# Patient Record
Sex: Male | Born: 1970 | Race: Black or African American | Hispanic: No | Marital: Single | Smoking: Former smoker
Health system: Southern US, Community
[De-identification: ages and names within clinical notes are randomized; demographics above are authoritative.]

## PROBLEM LIST (undated history)

## (undated) DIAGNOSIS — I10 Essential (primary) hypertension: Secondary | ICD-10-CM

## (undated) DIAGNOSIS — E785 Hyperlipidemia, unspecified: Secondary | ICD-10-CM

## (undated) DIAGNOSIS — E66811 Obesity, class 1: Secondary | ICD-10-CM

## (undated) DIAGNOSIS — E669 Obesity, unspecified: Secondary | ICD-10-CM

## (undated) HISTORY — DX: Obesity, class 1: E66.811

## (undated) HISTORY — DX: Obesity, unspecified: E66.9

## (undated) HISTORY — DX: Hyperlipidemia, unspecified: E78.5

## (undated) HISTORY — DX: Essential (primary) hypertension: I10

---

## 2003-03-16 ENCOUNTER — Encounter: Admission: RE | Admit: 2003-03-16 | Discharge: 2003-03-16 | Payer: Self-pay | Admitting: Family Medicine

## 2003-03-16 ENCOUNTER — Ambulatory Visit (HOSPITAL_COMMUNITY): Admission: RE | Admit: 2003-03-16 | Discharge: 2003-03-16 | Payer: Self-pay | Admitting: Family Medicine

## 2003-04-05 ENCOUNTER — Ambulatory Visit (HOSPITAL_COMMUNITY): Admission: RE | Admit: 2003-04-05 | Discharge: 2003-04-05 | Payer: Self-pay | Admitting: Sports Medicine

## 2003-05-08 ENCOUNTER — Encounter: Admission: RE | Admit: 2003-05-08 | Discharge: 2003-05-08 | Payer: Self-pay | Admitting: Family Medicine

## 2003-11-14 ENCOUNTER — Encounter: Admission: RE | Admit: 2003-11-14 | Discharge: 2003-11-14 | Payer: Self-pay | Admitting: Family Medicine

## 2004-09-09 ENCOUNTER — Ambulatory Visit: Payer: Self-pay | Admitting: Family Medicine

## 2006-01-26 ENCOUNTER — Ambulatory Visit: Payer: Self-pay | Admitting: Family Medicine

## 2006-11-26 DIAGNOSIS — N529 Male erectile dysfunction, unspecified: Secondary | ICD-10-CM

## 2006-11-26 DIAGNOSIS — I10 Essential (primary) hypertension: Secondary | ICD-10-CM

## 2008-02-22 ENCOUNTER — Encounter (INDEPENDENT_AMBULATORY_CARE_PROVIDER_SITE_OTHER): Payer: Self-pay | Admitting: Family Medicine

## 2008-02-22 ENCOUNTER — Ambulatory Visit: Payer: Self-pay | Admitting: Family Medicine

## 2008-02-22 DIAGNOSIS — E669 Obesity, unspecified: Secondary | ICD-10-CM | POA: Insufficient documentation

## 2008-02-22 LAB — CONVERTED CEMR LAB
ALT: 14 units/L (ref 0–53)
AST: 14 units/L (ref 0–37)
Albumin: 4.6 g/dL (ref 3.5–5.2)
Alkaline Phosphatase: 62 units/L (ref 39–117)
BUN: 16 mg/dL (ref 6–23)
HDL: 48 mg/dL (ref >39)
LDL Cholesterol: 133 mg/dL — ABNORMAL HIGH (ref 0–99)
Potassium: 4.4 meq/L (ref 3.5–5.3)
Sodium: 142 meq/L (ref 135–145)

## 2008-02-28 ENCOUNTER — Encounter (INDEPENDENT_AMBULATORY_CARE_PROVIDER_SITE_OTHER): Payer: Self-pay | Admitting: Family Medicine

## 2008-12-13 ENCOUNTER — Ambulatory Visit: Payer: Self-pay | Admitting: Family Medicine

## 2008-12-13 DIAGNOSIS — R0789 Other chest pain: Secondary | ICD-10-CM

## 2009-01-15 ENCOUNTER — Ambulatory Visit: Payer: Self-pay | Admitting: Family Medicine

## 2009-01-15 ENCOUNTER — Encounter (INDEPENDENT_AMBULATORY_CARE_PROVIDER_SITE_OTHER): Payer: Self-pay | Admitting: Family Medicine

## 2009-01-16 ENCOUNTER — Encounter (INDEPENDENT_AMBULATORY_CARE_PROVIDER_SITE_OTHER): Payer: Self-pay | Admitting: Family Medicine

## 2009-01-16 DIAGNOSIS — R944 Abnormal results of kidney function studies: Secondary | ICD-10-CM | POA: Insufficient documentation

## 2009-01-16 LAB — CONVERTED CEMR LAB
CO2: 24 meq/L (ref 19–32)
Calcium: 10.2 mg/dL (ref 8.4–10.5)
Potassium: 4.5 meq/L (ref 3.5–5.3)
Sodium: 142 meq/L (ref 135–145)

## 2009-05-18 ENCOUNTER — Encounter: Payer: Self-pay | Admitting: *Deleted

## 2009-05-18 ENCOUNTER — Ambulatory Visit: Payer: Self-pay | Admitting: Family Medicine

## 2009-05-18 DIAGNOSIS — J029 Acute pharyngitis, unspecified: Secondary | ICD-10-CM

## 2009-05-18 LAB — CONVERTED CEMR LAB: Rapid Strep: NEGATIVE

## 2009-08-10 ENCOUNTER — Emergency Department (HOSPITAL_COMMUNITY): Admission: EM | Admit: 2009-08-10 | Discharge: 2009-08-10 | Payer: Self-pay | Admitting: Family Medicine

## 2009-08-10 ENCOUNTER — Encounter: Payer: Self-pay | Admitting: Family Medicine

## 2010-01-07 ENCOUNTER — Encounter: Payer: Self-pay | Admitting: Family Medicine

## 2010-10-31 NOTE — Miscellaneous (Signed)
Summary: Lisino-HCTZ refill x 1, needs to be seen  Clinical Lists Changes  Medications: Changed medication from LISINOPRIL-HYDROCHLOROTHIAZIDE 20-25 MG TABS (LISINOPRIL-HYDROCHLOROTHIAZIDE) one tablet daily to LISINOPRIL-HYDROCHLOROTHIAZIDE 20-25 MG TABS (LISINOPRIL-HYDROCHLOROTHIAZIDE) one tablet daily you need to been seen before you next refill can be sent in. - Signed Rx of LISINOPRIL-HYDROCHLOROTHIAZIDE 20-25 MG TABS (LISINOPRIL-HYDROCHLOROTHIAZIDE) one tablet daily you need to been seen before you next refill can be sent in.;  #31 x 0;  Signed;  Entered by: Jamie Brookes MD;  Authorized by: Jamie Brookes MD;  Method used: Electronically to [facility] [phone]*, [address] [place], [place]  [address], Ph: [phone], Fax: [phone]    Prescriptions: LISINOPRIL-HYDROCHLOROTHIAZIDE 20-25 MG TABS (LISINOPRIL-HYDROCHLOROTHIAZIDE) one tablet daily you need to been seen before you next refill can be sent in.  #31 x 0   Entered and Authorized by:   Jamie Brookes MD   Signed by:   Jamie Brookes MD on 01/07/2010   Method used:   Electronically to        [facility] [phone]* (retail)       [address]       [place], [place]  [address]       Ph: [phone]       Fax: [phone]   RxID:   [id number]

## 2017-06-22 ENCOUNTER — Encounter: Payer: Self-pay | Admitting: Podiatry

## 2017-06-22 ENCOUNTER — Ambulatory Visit (INDEPENDENT_AMBULATORY_CARE_PROVIDER_SITE_OTHER): Payer: 59

## 2017-06-22 ENCOUNTER — Other Ambulatory Visit: Payer: Self-pay | Admitting: Podiatry

## 2017-06-22 ENCOUNTER — Ambulatory Visit (INDEPENDENT_AMBULATORY_CARE_PROVIDER_SITE_OTHER): Payer: 59 | Admitting: Podiatry

## 2017-06-22 VITALS — BP 94/57 | HR 72 | Resp 16

## 2017-06-22 DIAGNOSIS — M79671 Pain in right foot: Secondary | ICD-10-CM

## 2017-06-22 DIAGNOSIS — M722 Plantar fascial fibromatosis: Secondary | ICD-10-CM

## 2017-06-22 MED ORDER — MELOXICAM 15 MG PO TABS: 15.0000 mg | ORAL_TABLET | Freq: Every day | ORAL | 3 refills | Status: DC

## 2017-06-22 MED ORDER — METHYLPREDNISOLONE 4 MG PO TBPK: ORAL_TABLET | ORAL | 0 refills | Status: DC

## 2017-06-22 NOTE — Patient Instructions (Signed)

## 2017-06-22 NOTE — Progress Notes (Signed)
  Subjective:  Patient ID: Nicholas Madden, male    DOB: Feb 13, 1971,  MRN: [id number] HPI Chief Complaint  Patient presents with  . Foot Pain    Plantar heel right - aching x months, AM pain, when walking feel like walking on side of foot, no treatment    46 y.o. male presents with the above complaint.   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 Denies any trauma to the heel but states that first thing in the morning and after sitting for a while is exquisitely painful.  No past medical history on file. No past surgical history on file.  Current Outpatient Prescriptions:  .  Cholecalciferol (VITAMIN D PO), Take by mouth., Disp: , Rfl:  .  lisinopril-hydrochlorothiazide (PRINZIDE,ZESTORETIC) 20-25 MG per tablet, Take 1 tablet by mouth daily. You need to be seen before you next refill can be sent in. , Disp: , Rfl:   No Known Allergies Review of Systems  All other systems reviewed and are negative.  Objective:   Vitals:   06/22/17 1033  BP: (!) 94/57  Pulse: 72  Resp: 16   General AA&O x3. Patient is alert and oriented.  Vascular Dorsalis pedis and posterior tibial pulses 2/4 bilat. Brisk capillary refill to all digits. Pedal hair present.  Neurologic Epicritic sensation grossly intact.  Dermatologic No open lesions. Interspaces clear of maceration. Nails well groomed and normal in appearance.  Orthopedic: MMT 5/5 in dorsiflexion, plantarflexion, inversion, and eversion. Normal joint ROM without pain or crepitus.Pain on palpation medial calcaneal tubercle of the right heel.    Radiographs:  Radiographs demonstrate soft tissue increase in density at the plantar fascia insertion site right.  Assessment & Plan:   Assessment: Plantar fasciitis right.  Plan: Discussed etiology pathology conservative versus surgical  therapies. Injected the right heel today with Kenalog and local anesthetic started him on a Medrol Dosepak to be followed by meloxicam. Dispensed a fracture brace and night splint. Discussed appropriate shoe gear stretching exercises and ice therapy. He was provided with a instructions in stretching exercises prior to leaving the office.     Max T. [place], [place]

## 2017-07-20 ENCOUNTER — Encounter: Payer: Self-pay | Admitting: Podiatry

## 2017-07-20 ENCOUNTER — Ambulatory Visit (INDEPENDENT_AMBULATORY_CARE_PROVIDER_SITE_OTHER): Payer: 59 | Admitting: Podiatry

## 2017-07-20 ENCOUNTER — Ambulatory Visit: Payer: 59 | Admitting: Podiatry

## 2017-07-20 DIAGNOSIS — M722 Plantar fascial fibromatosis: Secondary | ICD-10-CM | POA: Diagnosis not present

## 2017-07-21 NOTE — Progress Notes (Signed)
He presents today for follow-up of his plantar fasciitis. He states that he is 100% improved.  Objective: Pulses are palpable. He has no pain on palpation medial calcaneal tubercle of the right heel.  Assessment: Plantar fasciitis resolved 100%.  Plan: I highly recommended that he continue all conservative therapies including his plantar fascial brace his night splint and anti-inflammatories. I also recommended that he continue his appropriate shoe gear. I suggested he continue all this 1 more month follow-up with me with any recurrence.

## 2017-08-24 ENCOUNTER — Encounter (INDEPENDENT_AMBULATORY_CARE_PROVIDER_SITE_OTHER): Payer: 59 | Admitting: Podiatry

## 2017-08-24 NOTE — Progress Notes (Signed)
This encounter was created in error - please disregard.

## 2017-10-26 ENCOUNTER — Ambulatory Visit: Payer: 59 | Admitting: Podiatry

## 2017-10-27 ENCOUNTER — Other Ambulatory Visit: Payer: Self-pay

## 2017-10-27 MED ORDER — MELOXICAM 15 MG PO TABS: 15.0000 mg | ORAL_TABLET | Freq: Every day | ORAL | 3 refills | Status: DC

## 2017-10-27 NOTE — Telephone Encounter (Signed)
Pharmacy refill request for Meloxicam.  Per Dr. Al CorpusHyatt, ok to refill    Script sent to pharmacy

## 2018-04-06 ENCOUNTER — Other Ambulatory Visit
Admission: RE | Admit: 2018-04-06 | Discharge: 2018-04-06 | Disposition: A | Discharge status: Home / Self Care | Payer: 59 | Source: Ambulatory Visit | Attending: Internal Medicine | Admitting: Internal Medicine

## 2018-04-06 DIAGNOSIS — I10 Essential (primary) hypertension: Secondary | ICD-10-CM | POA: Diagnosis not present

## 2018-04-06 LAB — LIPID PANEL
Cholesterol: 206 mg/dL — ABNORMAL HIGH (ref 0–200)
HDL: 46 mg/dL (ref >40)
LDL Cholesterol: 145 mg/dL — ABNORMAL HIGH (ref 0–99)
Total CHOL/HDL Ratio: 4.5 RATIO
Triglycerides: 76 mg/dL (ref <150)
VLDL: 15 mg/dL (ref 0–40)

## 2018-04-06 LAB — BASIC METABOLIC PANEL
Anion gap: 5 (ref 5–15)
BUN: 18 mg/dL (ref 6–20)
CO2: 29 mmol/L (ref 22–32)
Calcium: 9.5 mg/dL (ref 8.9–10.3)
Chloride: 108 mmol/L (ref 98–111)
Creatinine, Ser: 1.31 mg/dL — ABNORMAL HIGH (ref 0.61–1.24)
GFR calc Af Amer: >60 mL/min (ref >60)
GFR calc non Af Amer: >60 mL/min (ref >60)
Glucose, Bld: 100 mg/dL — ABNORMAL HIGH (ref 70–99)
Potassium: 4.2 mmol/L (ref 3.5–5.1)
Sodium: 142 mmol/L (ref 135–145)

## 2018-04-06 LAB — TSH: TSH: 1.255 u[IU]/mL (ref 0.350–4.500)

## 2018-04-06 LAB — CBC
HCT: 41.5 % (ref 40.0–52.0)
Hemoglobin: 13.7 g/dL (ref 13.0–18.0)
MCH: 29.7 pg (ref 26.0–34.0)
MCHC: 33.1 g/dL (ref 32.0–36.0)
MCV: 89.7 fL (ref 80.0–100.0)
Platelets: 230 10*3/uL (ref 150–440)
RBC: 4.62 MIL/uL (ref 4.40–5.90)
RDW: 12.3 % (ref 11.5–14.5)
WBC: 4.4 10*3/uL (ref 3.8–10.6)

## 2018-04-07 LAB — VITAMIN D 25 HYDROXY (VIT D DEFICIENCY, FRACTURES): Vit D, 25-Hydroxy: 73.2 ng/mL (ref 30.0–100.0)

## 2018-04-09 LAB — HEMOGLOBIN A1C
Hgb A1c MFr Bld: 4.8 % (ref 4.8–5.6)
Mean Plasma Glucose: 91.06 mg/dL

## 2019-06-02 ENCOUNTER — Emergency Department
Admission: EM | Admit: 2019-06-02 | Discharge: 2019-06-02 | Disposition: A | Discharge status: Home / Self Care | Payer: BC Managed Care – PPO | Attending: Emergency Medicine | Admitting: Emergency Medicine

## 2019-06-02 ENCOUNTER — Other Ambulatory Visit: Payer: Self-pay

## 2019-06-02 ENCOUNTER — Encounter: Payer: Self-pay | Admitting: Emergency Medicine

## 2019-06-02 ENCOUNTER — Emergency Department: Payer: BC Managed Care – PPO

## 2019-06-02 DIAGNOSIS — Y999 Unspecified external cause status: Secondary | ICD-10-CM | POA: Diagnosis not present

## 2019-06-02 DIAGNOSIS — Y92410 Unspecified street and highway as the place of occurrence of the external cause: Secondary | ICD-10-CM | POA: Insufficient documentation

## 2019-06-02 DIAGNOSIS — M542 Cervicalgia: Secondary | ICD-10-CM | POA: Insufficient documentation

## 2019-06-02 DIAGNOSIS — M549 Dorsalgia, unspecified: Secondary | ICD-10-CM | POA: Diagnosis not present

## 2019-06-02 DIAGNOSIS — M25552 Pain in left hip: Secondary | ICD-10-CM | POA: Insufficient documentation

## 2019-06-02 DIAGNOSIS — Y93I9 Activity, other involving external motion: Secondary | ICD-10-CM | POA: Diagnosis not present

## 2019-06-02 DIAGNOSIS — M25512 Pain in left shoulder: Secondary | ICD-10-CM | POA: Diagnosis not present

## 2019-06-02 DIAGNOSIS — M5489 Other dorsalgia: Secondary | ICD-10-CM | POA: Diagnosis not present

## 2019-06-02 MED ORDER — IBUPROFEN 600 MG PO TABS: 600.0000 mg | ORAL_TABLET | Freq: Four times a day (QID) | ORAL | 0 refills | Status: DC | PRN

## 2019-06-02 MED ORDER — CYCLOBENZAPRINE HCL 5 MG PO TABS: ORAL_TABLET | ORAL | 0 refills | Status: DC

## 2019-06-02 MED ORDER — CYCLOBENZAPRINE HCL 10 MG PO TABS
5.0000 mg | ORAL_TABLET | Freq: Once | ORAL | Status: AC
  Administered 2019-06-02: 5 mg via ORAL
  Filled 2019-06-02: qty 1

## 2019-06-02 MED ORDER — IBUPROFEN 600 MG PO TABS
600.0000 mg | ORAL_TABLET | Freq: Once | ORAL | Status: AC
  Administered 2019-06-02: 600 mg via ORAL
  Filled 2019-06-02: qty 1
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
## 2019-06-02 NOTE — Discharge Instructions (Addendum)
Your x-rays do not show any broken bones.  Please place ice on areas that are sore tonight.  You can take ibuprofen for pain and inflammation and Flexeril to help relax her muscles.  You will likely be more sore tomorrow.
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
## 2019-07-07 ENCOUNTER — Other Ambulatory Visit: Payer: Self-pay

## 2019-07-11 ENCOUNTER — Ambulatory Visit (INDEPENDENT_AMBULATORY_CARE_PROVIDER_SITE_OTHER): Payer: BC Managed Care – PPO | Admitting: Family Medicine

## 2019-07-11 ENCOUNTER — Other Ambulatory Visit: Payer: Self-pay

## 2019-07-11 ENCOUNTER — Encounter: Payer: Self-pay | Admitting: Family Medicine

## 2019-07-11 VITALS — BP 112/78 | HR 81 | Temp 96.6°F | Resp 16 | Ht 67.0 in | Wt 207.2 lb

## 2019-07-11 DIAGNOSIS — J302 Other seasonal allergic rhinitis: Secondary | ICD-10-CM

## 2019-07-11 DIAGNOSIS — I10 Essential (primary) hypertension: Secondary | ICD-10-CM

## 2019-07-11 DIAGNOSIS — H938X3 Other specified disorders of ear, bilateral: Secondary | ICD-10-CM

## 2019-07-11 LAB — CBC WITH DIFFERENTIAL/PLATELET
Basophils Absolute: 0 10*3/uL (ref 0.0–0.1)
Basophils Relative: 0.4 % (ref 0.0–3.0)
Eosinophils Absolute: 0.1 10*3/uL (ref 0.0–0.7)
Eosinophils Relative: 2.8 % (ref 0.0–5.0)
HCT: 39 % (ref 39.0–52.0)
Hemoglobin: 12.8 g/dL — ABNORMAL LOW (ref 13.0–17.0)
Lymphocytes Relative: 44.4 % (ref 12.0–46.0)
Lymphs Abs: 1.9 10*3/uL (ref 0.7–4.0)
MCHC: 32.9 g/dL (ref 30.0–36.0)
MCV: 90.3 fl (ref 78.0–100.0)
Monocytes Absolute: 0.4 10*3/uL (ref 0.1–1.0)
Monocytes Relative: 10.4 % (ref 3.0–12.0)
Neutro Abs: 1.8 10*3/uL (ref 1.4–7.7)
Neutrophils Relative %: 42 % — ABNORMAL LOW (ref 43.0–77.0)
Platelets: 244 10*3/uL (ref 150.0–400.0)
RBC: 4.32 Mil/uL (ref 4.22–5.81)
RDW: 12 % (ref 11.5–15.5)
WBC: 4.3 10*3/uL (ref 4.0–10.5)

## 2019-07-11 LAB — COMPREHENSIVE METABOLIC PANEL
ALT: 19 U/L (ref 0–53)
AST: 15 U/L (ref 0–37)
Albumin: 4.4 g/dL (ref 3.5–5.2)
Alkaline Phosphatase: 57 U/L (ref 39–117)
BUN: 11 mg/dL (ref 6–23)
CO2: 28 mEq/L (ref 19–32)
Calcium: 9.8 mg/dL (ref 8.4–10.5)
Chloride: 104 mEq/L (ref 96–112)
Creatinine, Ser: 1.27 mg/dL (ref 0.40–1.50)
GFR: 73.8 mL/min (ref >60.00)
Glucose, Bld: 119 mg/dL — ABNORMAL HIGH (ref 70–99)
Potassium: 4 mEq/L (ref 3.5–5.1)
Sodium: 140 mEq/L (ref 135–145)
Total Bilirubin: 0.9 mg/dL (ref 0.2–1.2)
Total Protein: 6.9 g/dL (ref 6.0–8.3)

## 2019-07-11 LAB — LIPID PANEL
Cholesterol: 187 mg/dL (ref 0–200)
HDL: 43 mg/dL (ref >39.00)
LDL Cholesterol: 130 mg/dL — ABNORMAL HIGH (ref 0–99)
NonHDL: 144.41
Total CHOL/HDL Ratio: 4
Triglycerides: 72 mg/dL (ref 0.0–149.0)
VLDL: 14.4 mg/dL (ref 0.0–40.0)

## 2019-07-11 LAB — HEMOGLOBIN A1C: Hgb A1c MFr Bld: 5.1 % (ref 4.6–6.5)

## 2019-07-11 LAB — TSH: TSH: 1.41 u[IU]/mL (ref 0.35–4.50)

## 2019-07-11 NOTE — Progress Notes (Signed)
Subjective:    Patient ID: Nicholas Madden, male    DOB: March 05, 1971, 48 y.o.   MRN: [id number]  HPI   Patient presents to clinic to establish PCP.  Only takes medication for blood pressure.  Former smoker, quit 15 years ago.    Past medical, social, surgical and family history reviewed and updated accordingly in chart.  Only complaint today is ear fullness, left ear more so.  Does not currently take any sort of allergy medicine.  Denies fever.  Denies severe pain in sinuses.  Denies hearing loss.  Denies thick nasal congestion.  States he has taken medicine for this before, but cannot recall the name.   Patient Active Problem List   Diagnosis Date Noted  . ACUTE PHARYNGITIS 05/18/2009  . NONSPECIFIC ABNORM RESULTS KIDNEY FUNCTION STUDY 01/16/2009  . CHEST PAIN, ATYPICAL 12/13/2008  . OBESITY, UNSPECIFIED 02/22/2008  . Essential hypertension 11/26/2006  . 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 Smoking status: Former Research scientist (life sciences)  . Smokeless tobacco: Never Used  . Tobacco comment: Over 55yr ago  Substance Use Topics  . Alcohol use: No   History reviewed. No pertinent surgical history.  History reviewed. No pertinent family history.   Review of Systems  Constitutional: Negative for chills, fatigue and fever.  HENT: Negative for congestion, sinus pain and sore throat. +left ear pain Eyes: Negative.   Respiratory: Negative for cough, shortness of breath and wheezing.   Cardiovascular: Negative for chest pain, palpitations and leg swelling.  Gastrointestinal: Negative for abdominal pain, diarrhea, nausea and vomiting.  Genitourinary: Negative for dysuria, frequency and urgency.  Musculoskeletal: Negative for arthralgias and myalgias.  Skin: Negative for color change, pallor and rash.  Neurological: Negative for syncope, light-headedness and headaches.  Psychiatric/Behavioral: The patient is not nervous/anxious.       Objective:   Physical Exam  Vitals signs and nursing note reviewed.  Constitutional:      General: He is not in acute distress.    Appearance: He is not toxic-appearing.  HENT:     Right Ear: Ear canal and external ear normal.     Left Ear: Ear canal and external ear normal.     Ears:     Comments: Mild fullness bilat TMs, appears consistent with some fluid buildup, most likely from allergies Eyes:     General: No scleral icterus.    Extraocular Movements: Extraocular movements intact.     Conjunctiva/sclera: Conjunctivae normal.     Pupils: Pupils are equal, round, and reactive to light.  Neck:     Musculoskeletal: Normal range of motion. No neck rigidity.     Vascular: No carotid bruit.  Cardiovascular:     Rate and Rhythm: Normal rate and regular rhythm.     Heart sounds: Normal heart sounds.  Pulmonary:     Effort: Pulmonary effort is normal. No respiratory distress.     Breath sounds: Normal breath sounds.  Neurological:     General: No focal deficit present.     Mental Status: He is alert and oriented to person, place, and time.  Psychiatric:        Mood and Affect: Mood normal.        Behavior: Behavior normal.    Today's Vitals   07/11/19 0821  BP: 112/78  Pulse: 81  Resp: 16  Temp: (!) 96.6 F (35.9 C)  TempSrc: Temporal  SpO2: 98%  Weight: 207 lb 3.2 oz (94 kg)  Height: 5'  7" (1.702 m)   Body mass index is 32.45 kg/m.    Assessment & Plan:    1. Essential hypertension  He will continue lisinopril hydrochlorothiazide as this was we will get blood work in clinic today for electrolytes and screening for thyroid, cholesterol & diabetes.  - CBC w/Diff - Comp Met (CMET) - TSH - Lipid panel - HgB A1c  2. Ear fullness, bilateral / Seasonal allergies  Mild fullness in bilateral tympanic membranes consistent with irritation from seasonal allergies and/or eustachian tube dysfunction.  Advised patient to begin taking oral antihistamine once daily Claritin, Zyrtec or Allegra and also  use Flonase nasal spray daily to help get things open and draining.  Antibiotic, reassured patient that he does not have ear infection and I do not feel antibiotics needed at this time.     Had flu vaccine at work   We will have patient follow-up in 6 months for radiographic recheck on chronic conditions and whenever needed.  Patient aware he can return to clinic anytime questions or concerns.  If lab work reveals a need for patient to return to clinic sooner, we will let him know.

## 2019-11-07 ENCOUNTER — Other Ambulatory Visit: Payer: Self-pay | Admitting: Otolaryngology

## 2019-11-07 DIAGNOSIS — IMO0001 Reserved for inherently not codable concepts without codable children: Secondary | ICD-10-CM

## 2019-11-07 DIAGNOSIS — H9042 Sensorineural hearing loss, unilateral, left ear, with unrestricted hearing on the contralateral side: Secondary | ICD-10-CM

## 2019-11-07 DIAGNOSIS — H90A22 Sensorineural hearing loss, unilateral, left ear, with restricted hearing on the contralateral side: Secondary | ICD-10-CM | POA: Diagnosis not present

## 2019-11-07 DIAGNOSIS — H93292 Other abnormal auditory perceptions, left ear: Secondary | ICD-10-CM | POA: Diagnosis not present

## 2019-11-18 ENCOUNTER — Ambulatory Visit: Payer: No Typology Code available for payment source

## 2019-11-20 ENCOUNTER — Ambulatory Visit
Admission: RE | Admit: 2019-11-20 | Discharge: 2019-11-20 | Disposition: A | Discharge status: Home / Self Care | Payer: BC Managed Care – PPO | Source: Ambulatory Visit | Attending: Otolaryngology | Admitting: Otolaryngology

## 2019-11-20 ENCOUNTER — Other Ambulatory Visit: Payer: Self-pay

## 2019-11-20 DIAGNOSIS — H9042 Sensorineural hearing loss, unilateral, left ear, with unrestricted hearing on the contralateral side: Secondary | ICD-10-CM | POA: Diagnosis not present

## 2019-11-20 DIAGNOSIS — IMO0001 Reserved for inherently not codable concepts without codable children: Secondary | ICD-10-CM

## 2019-11-20 LAB — POCT I-STAT CREATININE: Creatinine, Ser: 1.2 mg/dL (ref 0.61–1.24)

## 2019-11-20 MED ORDER — GADOBUTROL 1 MMOL/ML IV SOLN
10.0000 mL | Freq: Once | INTRAVENOUS | Status: AC | PRN
  Administered 2019-11-20: 10 mL via INTRAVENOUS

## 2020-01-11 ENCOUNTER — Ambulatory Visit: Payer: Self-pay | Admitting: Family Medicine

## 2020-02-23 ENCOUNTER — Other Ambulatory Visit: Payer: Self-pay

## 2020-02-28 ENCOUNTER — Other Ambulatory Visit: Payer: Self-pay

## 2020-02-28 ENCOUNTER — Ambulatory Visit (INDEPENDENT_AMBULATORY_CARE_PROVIDER_SITE_OTHER): Payer: BC Managed Care – PPO | Admitting: Nurse Practitioner

## 2020-02-28 ENCOUNTER — Encounter: Payer: Self-pay | Admitting: Nurse Practitioner

## 2020-02-28 VITALS — BP 110/70 | HR 84 | Temp 97.6°F | Ht 67.0 in | Wt 198.6 lb

## 2020-02-28 DIAGNOSIS — E785 Hyperlipidemia, unspecified: Secondary | ICD-10-CM

## 2020-02-28 DIAGNOSIS — D649 Anemia, unspecified: Secondary | ICD-10-CM

## 2020-02-28 DIAGNOSIS — I10 Essential (primary) hypertension: Secondary | ICD-10-CM | POA: Diagnosis not present

## 2020-02-28 DIAGNOSIS — Z6831 Body mass index (BMI) 31.0-31.9, adult: Secondary | ICD-10-CM | POA: Diagnosis not present

## 2020-02-28 DIAGNOSIS — E871 Hypo-osmolality and hyponatremia: Secondary | ICD-10-CM

## 2020-02-28 DIAGNOSIS — D696 Thrombocytopenia, unspecified: Secondary | ICD-10-CM

## 2020-02-28 LAB — COMPREHENSIVE METABOLIC PANEL
ALT: 21 U/L (ref 0–53)
AST: 25 U/L (ref 0–37)
Albumin: 4.4 g/dL (ref 3.5–5.2)
Alkaline Phosphatase: 56 U/L (ref 39–117)
BUN: 21 mg/dL (ref 6–23)
CO2: 26 mEq/L (ref 19–32)
Calcium: 9.7 mg/dL (ref 8.4–10.5)
Chloride: 100 mEq/L (ref 96–112)
Creatinine, Ser: 1.2 mg/dL (ref 0.40–1.50)
GFR: 78.57 mL/min (ref >60.00)
Glucose, Bld: 85 mg/dL (ref 70–99)
Potassium: 4.3 mEq/L (ref 3.5–5.1)
Sodium: 133 mEq/L — ABNORMAL LOW (ref 135–145)
Total Bilirubin: 0.7 mg/dL (ref 0.2–1.2)
Total Protein: 6.9 g/dL (ref 6.0–8.3)

## 2020-02-28 LAB — CBC WITH DIFFERENTIAL/PLATELET
Basophils Absolute: 0 10*3/uL (ref 0.0–0.1)
Basophils Relative: 0.4 % (ref 0.0–3.0)
Eosinophils Absolute: 0.1 10*3/uL (ref 0.0–0.7)
Eosinophils Relative: 1.1 % (ref 0.0–5.0)
HCT: 40.9 % (ref 39.0–52.0)
Hemoglobin: 13.5 g/dL (ref 13.0–17.0)
Lymphocytes Relative: 19.3 % (ref 12.0–46.0)
Lymphs Abs: 1.4 10*3/uL (ref 0.7–4.0)
MCHC: 32.9 g/dL (ref 30.0–36.0)
MCV: 91.5 fl (ref 78.0–100.0)
Monocytes Absolute: 0.4 10*3/uL (ref 0.1–1.0)
Monocytes Relative: 5.9 % (ref 3.0–12.0)
Neutro Abs: 5.4 10*3/uL (ref 1.4–7.7)
Neutrophils Relative %: 73.3 % (ref 43.0–77.0)
Platelets: 131 10*3/uL — ABNORMAL LOW (ref 150.0–400.0)
RBC: 4.47 Mil/uL (ref 4.22–5.81)
RDW: 12.4 % (ref 11.5–15.5)
WBC: 7.3 10*3/uL (ref 4.0–10.5)

## 2020-02-28 NOTE — Progress Notes (Addendum)
Established Patient Office Visit  Subjective:  Patient ID: Nicholas Madden, male    DOB: 1971/06/10  Age: 49 y.o. MRN: [id number]  CC:  Chief Complaint  Patient presents with  . Transitions Of Care    CPE    HPI Nicholas Madden is a 48 year old with history of hypertension presents to establish care with a new provider. He saw Lauren once on 07/19/2019 for new patient visit.   HTN essential: Patient is at goal on lisinopril/HCTZ 20/25 mg 1 tablet daily.  He reports he is doing well on the medication.  He has noted no side effects.  He denies chest pain, shortness of breath, DOE, edema, or any lip swelling.  BP Readings from Last 3 Encounters:  02/28/20 110/70  07/11/19 112/78  06/02/19 (!) 122/93    HLD: untreated LDL 130.  Not at goal but working on this.  He went to the gym this last month. He was feeling tired and he started working out and is now getting energy and feels a lot better.  He has hired a Clinical research associate, he has lost weight, and working on his healthy diet to lower his weight and cholesterol.  He saw ENT- didn't pass hearing test-MRI done- negative.  Lab Results  Component Value Date   CHOL 187 07/11/2019   HDL 43.00 07/11/2019   LDLCALC 130 (H) 07/11/2019   TRIG 72.0 07/11/2019   CHOLHDL 4 07/11/2019    Obesity: BMI 31: Not at goal but working on this.  He has some following an active exercise plan with a trainer, going to the gym regularly, and eating healthier.  Goal weight is a BMI less than 25. Wt Readings from Last 3 Encounters:  02/28/20 198 lb 9.6 oz (90.1 kg)  07/11/19 207 lb 3.2 oz (94 kg)    Past Medical History:  Diagnosis Date  . Hypertension     No past surgical history on file.  No family history on file.  Social History   Socioeconomic History  . Marital status: Single    Spouse name: Not on file  . Number of children: Not on file  . Years of education: Not on file  . Highest education level: Not on file  Occupational History  . Not  on file  Tobacco Use  . Smoking status: Former Research scientist (life sciences)  . Smokeless tobacco: Never Used  . Tobacco comment: Over 82yr ago  Substance and Sexual Activity  . Alcohol use: No  . Drug use: Never  . Sexual activity: Not on file  Other Topics Concern  . Not on file  Social History Narrative  . Not on file   Social Determinants of Health   Financial Resource Strain:   . Difficulty of Paying Living Expenses:   Food Insecurity:   . Worried About RCharity fundraiserin the Last Year:   . RArboriculturistin the Last Year:   Transportation Needs:   . LFilm/video editor(Medical):   .Marland KitchenLack of Transportation (Non-Medical):   Physical Activity:   . Days of Exercise per Week:   . Minutes of Exercise per Session:   Stress:   . Feeling of Stress :   Social Connections:   . Frequency of Communication with Friends and Family:   . Frequency of Social Gatherings with Friends and Family:   . Attends Religious Services:   . Active Member of Clubs or Organizations:   . Attends CArchivistMeetings:   .Marland KitchenMarital  Status:   Intimate Partner Violence:   . Fear of Current or Ex-Partner:   . Emotionally Abused:   Marland Kitchen Physically Abused:   . Sexually Abused:     Outpatient Medications Prior to Visit  Medication Sig Dispense Refill  . lisinopril-hydrochlorothiazide (PRINZIDE,ZESTORETIC) 20-25 MG per tablet Take 1 tablet by mouth daily. You need to be seen before you next refill can be sent in.     . Multiple Vitamin (ONE-A-DAY MENS PO) Take by mouth.     No facility-administered medications prior to visit.    No Known Allergies  ROS Pertinent positives as noted in history of present illness.   Review of Systems  Constitutional: Negative for chills and fever.  HENT: Positive for congestion and hearing loss.         He reports he flunked the hearing test, and saw ENT.he had a MRI of the brain with and without contrast on 11/20/2019 with negative findings.  He currently has no acute  ear complaints.     Eyes: Negative.   Respiratory: Negative for cough and shortness of breath.   Cardiovascular: Negative for chest pain, palpitations and leg swelling.  Gastrointestinal: Negative for abdominal pain.  Genitourinary: Negative for difficulty urinating.  Musculoskeletal: Negative.   Neurological: Negative.   Psychiatric/Behavioral:       He denies depression/anxiety. No SI/HI.       Objective:    Physical Exam  Constitutional: He is oriented to person, place, and time. He appears well-developed and well-nourished.  HENT:  Head: Normocephalic and atraumatic.  Eyes: Pupils are equal, round, and reactive to light.  Cardiovascular: Normal rate, regular rhythm and normal heart sounds.  Pulmonary/Chest: Effort normal and breath sounds normal.  Abdominal: Soft. There is no abdominal tenderness.  Musculoskeletal:        General: Normal range of motion.     Cervical back: Normal range of motion and neck supple.  Neurological: He is alert and oriented to person, place, and time.  Skin: Skin is warm and dry.  Psychiatric: He has a normal mood and affect. His behavior is normal.  Vitals reviewed.   BP 110/70 (BP Location: Left Arm, Patient Position: Sitting, Cuff Size: Normal)   Pulse 84   Temp 97.6 F (36.4 C) (Skin)   Ht 5' 7"  (1.702 m)   Wt 198 lb 9.6 oz (90.1 kg)   SpO2 98%   BMI 31.11 kg/m  Wt Readings from Last 3 Encounters:  02/28/20 198 lb 9.6 oz (90.1 kg)  07/11/19 207 lb 3.2 oz (94 kg)     Health Maintenance Due  Topic Date Due  . COVID-19 Vaccine (1) Never done  . HIV Screening  Never done  . TETANUS/TDAP  11/27/2012    There are no preventive care reminders to display for this patient.  Lab Results  Component Value Date   TSH 1.41 07/11/2019   Lab Results  Component Value Date   WBC 7.3 02/28/2020   HGB 13.5 02/28/2020   HCT 40.9 02/28/2020   MCV 91.5 02/28/2020   PLT 131.0 (L) 02/28/2020   Lab Results  Component Value Date   NA 133  (L) 02/28/2020   K 4.3 02/28/2020   CO2 26 02/28/2020   GLUCOSE 85 02/28/2020   BUN 21 02/28/2020   CREATININE 1.20 02/28/2020   BILITOT 0.7 02/28/2020   ALKPHOS 56 02/28/2020   AST 25 02/28/2020   ALT 21 02/28/2020   PROT 6.9 02/28/2020   ALBUMIN 4.4 02/28/2020  CALCIUM 9.7 02/28/2020   ANIONGAP 5 04/06/2018   GFR 78.57 02/28/2020   Lab Results  Component Value Date   CHOL 187 07/11/2019   Lab Results  Component Value Date   HDL 43.00 07/11/2019   Lab Results  Component Value Date   LDLCALC 130 (H) 07/11/2019   Lab Results  Component Value Date   TRIG 72.0 07/11/2019   Lab Results  Component Value Date   CHOLHDL 4 07/11/2019   Lab Results  Component Value Date   HGBA1C 5.1 07/11/2019      Assessment & Plan:   Problem List Items Addressed This Visit      Cardiovascular and Mediastinum   Essential hypertension - Primary   Relevant Orders   Comp Met (CMET) (Completed)     Other   Hyperlipidemia   BMI 31.0-31.9,adult   Anemia   Relevant Orders   CBC with Differential/Platelet (Completed)    Continue with same blood pressure medication.  Patient was reviewing fatigue, and lethargic, and now he is feeling much improved with regular exercise.  Continue with weight loss, and increased activity. We will check labs again in 3 months for fasting blood work to check fasting LDL and if still elevated, will consider adding Crestor. OV as well. Repeat the PHQ-9 at that time as he is feeling better with lifestyle choices.   No orders of the defined types were placed in this encounter.   Follow-up: Return in about 3 months (around 05/30/2020).  This visit occurred during the SARS-CoV-2 public health emergency.  Safety protocols were in place, including screening questions prior to the visit, additional usage of staff PPE, and extensive cleaning of exam room while observing appropriate contact time as indicated for disinfecting solutions.    Denice Paradise, NP

## 2020-02-28 NOTE — Patient Instructions (Addendum)
Please go to the lab today.  Continue with same blood pressure medication.   Congratulations on the good lifestyle choices.  Continue with weight loss, and increased activity.  We will check labs again in 3 months for fasting blood work and office visit.    Obesity, Adult Obesity is the condition of having too much total body fat. Being overweight or obese means that your weight is greater than what is considered healthy for your body size. Obesity is determined by a measurement called BMI. BMI is an estimate of body fat and is calculated from height and weight. For adults, a BMI of 30 or higher is considered obese. Obesity can lead to other health concerns and major illnesses, including:  Stroke.  Coronary artery disease (CAD).  Type 2 diabetes.  Some types of cancer, including cancers of the colon, breast, uterus, and gallbladder.  Osteoarthritis.  High blood pressure (hypertension).  High cholesterol.  Sleep apnea.  Gallbladder stones.  Infertility problems. What are the causes? Common causes of this condition include:  Eating daily meals that are high in calories, sugar, and fat.  Being born with genes that may make you more likely to become obese.  Having a medical condition that causes obesity, including: ? Hypothyroidism. ? Polycystic ovarian syndrome (PCOS). ? Binge-eating disorder. ? Cushing syndrome.  Taking certain medicines, such as steroids, antidepressants, and seizure medicines.  Not being physically active (sedentary lifestyle).  Not getting enough sleep.  Drinking high amounts of sugar-sweetened beverages, such as soft drinks. What increases the risk? The following factors may make you more likely to develop this condition:  Having a family history of obesity.  Being a woman of African American descent.  Being a man of Hispanic descent.  Living in an area with limited access to: ? [place] [facility], recreation centers, or sidewalks. ? Healthy food  choices, such as grocery stores and farmers' markets. What are the signs or symptoms? The main sign of this condition is having too much body fat. How is this diagnosed? This condition is diagnosed based on:  Your BMI. If you are an adult with a BMI of 30 or higher, you are considered obese.  Your waist circumference. This measures the distance around your waistline.  Your skinfold thickness. Your health care provider may gently pinch a fold of your skin and measure it. You may have other tests to check for underlying conditions. How is this treated? Treatment for this condition often includes changing your lifestyle. Treatment may include some or all of the following:  Dietary changes. This may include developing a healthy meal plan.  Regular physical activity. This may include activity that causes your heart to beat faster (aerobic exercise) and strength training. Work with your health care provider to design an exercise program that works for you.  Medicine to help you lose weight if you are unable to lose 1 pound a week after 6 weeks of healthy eating and more physical activity.  Treating conditions that cause the obesity (underlying conditions).  Surgery. Surgical options may include gastric banding and gastric bypass. Surgery may be done if: ? Other treatments have not helped to improve your condition. ? You have a BMI of 40 or higher. ? You have life-threatening health problems related to obesity. Follow these instructions at home: Eating and drinking   Follow recommendations from your health care provider about what you eat and drink. Your health care provider may advise you to: ? Limit fast food, sweets, and processed snack foods. ?  Choose low-fat options, such as low-fat milk instead of whole milk. ? Eat 5 or more servings of fruits or vegetables every day. ? Eat at home more often. This gives you more control over what you eat. ? Choose healthy foods when you eat  out. ? Learn to read food labels. This will help you understand how much food is considered 1 serving. ? Learn what a healthy serving size is. ? Keep low-fat snacks available. ? Limit sugary drinks, such as soda, fruit juice, sweetened iced tea, and flavored milk.  Drink enough water to keep your urine pale yellow.  Do not follow a fad diet. Fad diets can be unhealthy and even dangerous. Physical activity  Exercise regularly, as told by your health care provider. ? Most adults should get up to 150 minutes of moderate-intensity exercise every week. ? Ask your health care provider what types of exercise are safe for you and how often you should exercise.  Warm up and stretch before being active.  Cool down and stretch after being active.  Rest between periods of activity. Lifestyle  Work with your health care provider and a dietitian to set a weight-loss goal that is healthy and reasonable for you.  Limit your screen time.  Find ways to reward yourself that do not involve food.  Do not drink alcohol if: ? Your health care provider tells you not to drink. ? You are pregnant, may be pregnant, or are planning to become pregnant.  If you drink alcohol: ? Limit how much you use to:  0-1 drink a day for women.  0-2 drinks a day for men. ? Be aware of how much alcohol is in your drink. In the [place] one drink equals one 12 oz bottle of beer (355 mL), one 5 oz glass of wine (148 mL), or one 1 oz glass of hard liquor (44 mL). General instructions  Keep a weight-loss journal to keep track of the food you eat and how much exercise you get.  Take over-the-counter and prescription medicines only as told by your health care provider.  Take vitamins and supplements only as told by your health care provider.  Consider joining a support group. Your health care provider may be able to recommend a support group.  Keep all follow-up visits as told by your health care provider. This is  important. Contact a health care provider if:  You are unable to meet your weight loss goal after 6 weeks of dietary and lifestyle changes. Get help right away if you are having:  Trouble breathing.  Suicidal thoughts or behaviors. Summary  Obesity is the condition of having too much total body fat.  Being overweight or obese means that your weight is greater than what is considered healthy for your body size.  Work with your health care provider and a dietitian to set a weight-loss goal that is healthy and reasonable for you.  Exercise regularly, as told by your health care provider. Ask your health care provider what types of exercise are safe for you and how often you should exercise. This information is not intended to replace advice given to you by your health care provider. Make sure you discuss any questions you have with your health care provider. Document Revised: 05/20/2018 Document Reviewed: 05/20/2018 Elsevier Patient Education  [place].  Hypertension, Adult High blood pressure (hypertension) is when the force of blood pumping through the arteries is too strong. The arteries are the blood vessels that carry blood  from the heart throughout the body. Hypertension forces the heart to work harder to pump blood and may cause arteries to become narrow or stiff. Untreated or uncontrolled hypertension can cause a heart attack, heart failure, a stroke, kidney disease, and other problems. A blood pressure reading consists of a higher number over a lower number. Ideally, your blood pressure should be below 120/80. The first ("top") number is called the systolic pressure. It is a measure of the pressure in your arteries as your heart beats. The second ("bottom") number is called the diastolic pressure. It is a measure of the pressure in your arteries as the heart relaxes. What are the causes? The exact cause of this condition is not known. There are some conditions that result in  or are related to high blood pressure. What increases the risk? Some risk factors for high blood pressure are under your control. The following factors may make you more likely to develop this condition:  Smoking.  Having type 2 diabetes mellitus, high cholesterol, or both.  Not getting enough exercise or physical activity.  Being overweight.  Having too much fat, sugar, calories, or salt (sodium) in your diet.  Drinking too much alcohol. Some risk factors for high blood pressure may be difficult or impossible to change. Some of these factors include:  Having chronic kidney disease.  Having a family history of high blood pressure.  Age. Risk increases with age.  Race. You may be at higher risk if you are African American.  Gender. Men are at higher risk than women before age 83. After age 27, women are at higher risk than men.  Having obstructive sleep apnea.  Stress. What are the signs or symptoms? High blood pressure may not cause symptoms. Very high blood pressure (hypertensive crisis) may cause:  Headache.  Anxiety.  Shortness of breath.  Nosebleed.  Nausea and vomiting.  Vision changes.  Severe chest pain.  Seizures. How is this diagnosed? This condition is diagnosed by measuring your blood pressure while you are seated, with your arm resting on a flat surface, your legs uncrossed, and your feet flat on the floor. The cuff of the blood pressure monitor will be placed directly against the skin of your upper arm at the level of your heart. It should be measured at least twice using the same arm. Certain conditions can cause a difference in blood pressure between your right and left arms. Certain factors can cause blood pressure readings to be lower or higher than normal for a short period of time:  When your blood pressure is higher when you are in a health care provider's office than when you are at home, this is called white coat hypertension. Most people with  this condition do not need medicines.  When your blood pressure is higher at home than when you are in a health care provider's office, this is called masked hypertension. Most people with this condition may need medicines to control blood pressure. If you have a high blood pressure reading during one visit or you have normal blood pressure with other risk factors, you may be asked to:  Return on a different day to have your blood pressure checked again.  Monitor your blood pressure at home for 1 week or longer. If you are diagnosed with hypertension, you may have other blood or imaging tests to help your health care provider understand your overall risk for other conditions. How is this treated? This condition is treated by making healthy lifestyle  changes, such as eating healthy foods, exercising more, and reducing your alcohol intake. Your health care provider may prescribe medicine if lifestyle changes are not enough to get your blood pressure under control, and if:  Your systolic blood pressure is above 130.  Your diastolic blood pressure is above 80. Your personal target blood pressure may vary depending on your medical conditions, your age, and other factors. Follow these instructions at home: Eating and drinking   Eat a diet that is high in fiber and potassium, and low in sodium, added sugar, and fat. An example eating plan is called the DASH (Dietary Approaches to Stop Hypertension) diet. To eat this way: ? Eat plenty of fresh fruits and vegetables. Try to fill one half of your plate at each meal with fruits and vegetables. ? Eat whole grains, such as whole-wheat pasta, brown rice, or whole-grain bread. Fill about one fourth of your plate with whole grains. ? Eat or drink low-fat dairy products, such as skim milk or low-fat yogurt. ? Avoid fatty cuts of meat, processed or cured meats, and poultry with skin. Fill about one fourth of your plate with lean proteins, such as fish, chicken  without skin, beans, eggs, or tofu. ? Avoid pre-made and processed foods. These tend to be higher in sodium, added sugar, and fat.  Reduce your daily sodium intake. Most people with hypertension should eat less than 1,500 mg of sodium a day.  Do not drink alcohol if: ? Your health care provider tells you not to drink. ? You are pregnant, may be pregnant, or are planning to become pregnant.  If you drink alcohol: ? Limit how much you use to:  0-1 drink a day for women.  0-2 drinks a day for men. ? Be aware of how much alcohol is in your drink. In the [place] one drink equals one 12 oz bottle of beer (355 mL), one 5 oz glass of wine (148 mL), or one 1 oz glass of hard liquor (44 mL). Lifestyle   Work with your health care provider to maintain a healthy body weight or to lose weight. Ask what an ideal weight is for you.  Get at least 30 minutes of exercise most days of the week. Activities may include walking, swimming, or biking.  Include exercise to strengthen your muscles (resistance exercise), such as Pilates or lifting weights, as part of your weekly exercise routine. Try to do these types of exercises for 30 minutes at least 3 days a week.  Do not use any products that contain nicotine or tobacco, such as cigarettes, e-cigarettes, and chewing tobacco. If you need help quitting, ask your health care provider.  Monitor your blood pressure at home as told by your health care provider.  Keep all follow-up visits as told by your health care provider. This is important. Medicines  Take over-the-counter and prescription medicines only as told by your health care provider. Follow directions carefully. Blood pressure medicines must be taken as prescribed.  Do not skip doses of blood pressure medicine. Doing this puts you at risk for problems and can make the medicine less effective.  Ask your health care provider about side effects or reactions to medicines that you should watch  for. Contact a health care provider if you:  Think you are having a reaction to a medicine you are taking.  Have headaches that keep coming back (recurring).  Feel dizzy.  Have swelling in your ankles.  Have trouble with your vision. Get help  right away if you:  Develop a severe headache or confusion.  Have unusual weakness or numbness.  Feel faint.  Have severe pain in your chest or abdomen.  Vomit repeatedly.  Have trouble breathing. Summary  Hypertension is when the force of blood pumping through your arteries is too strong. If this condition is not controlled, it may put you at risk for serious complications.  Your personal target blood pressure may vary depending on your medical conditions, your age, and other factors. For most people, a normal blood pressure is less than 120/80.  Hypertension is treated with lifestyle changes, medicines, or a combination of both. Lifestyle changes include losing weight, eating a healthy, low-sodium diet, exercising more, and limiting alcohol. This information is not intended to replace advice given to you by your health care provider. Make sure you discuss any questions you have with your health care provider. Document Revised: 05/26/2018 Document Reviewed: 05/26/2018 Elsevier Patient Education  2020 [employer]  High Cholesterol  High cholesterol is a condition in which the blood has high levels of a white, waxy, fat-like substance (cholesterol). The human body needs small amounts of cholesterol. The liver makes all the cholesterol that the body needs. Extra (excess) cholesterol comes from the food that we eat. Cholesterol is carried from the liver by the blood through the blood vessels. If you have high cholesterol, deposits (plaques) may build up on the walls of your blood vessels (arteries). Plaques make the arteries narrower and stiffer. Cholesterol plaques increase your risk for heart attack and stroke. Work with your health care  provider to keep your cholesterol levels in a healthy range. What increases the risk? This condition is more likely to develop in people who:  Eat foods that are high in animal fat (saturated fat) or cholesterol.  Are overweight.  Are not getting enough exercise.  Have a family history of high cholesterol. What are the signs or symptoms? There are no symptoms of this condition. How is this diagnosed? This condition may be diagnosed from the results of a blood test.  If you are older than age 69, your health care provider may check your cholesterol every 4-6 years.  You may be checked more often if you already have high cholesterol or other risk factors for heart disease. The blood test for cholesterol measures:  "Bad" cholesterol (LDL cholesterol). This is the main type of cholesterol that causes heart disease. The desired level for LDL is less than 100.  "Good" cholesterol (HDL cholesterol). This type helps to protect against heart disease by cleaning the arteries and carrying the LDL away. The desired level for HDL is 60 or higher.  Triglycerides. These are fats that the body can store or burn for energy. The desired number for triglycerides is lower than 150.  Total cholesterol. This is a measure of the total amount of cholesterol in your blood, including LDL cholesterol, HDL cholesterol, and triglycerides. A healthy number is less than 200. How is this treated? This condition is treated with diet changes, lifestyle changes, and medicines. Diet changes  This may include eating more whole grains, fruits, vegetables, nuts, and fish.  This may also include cutting back on red meat and foods that have a lot of added sugar. Lifestyle changes  Changes may include getting at least 40 minutes of aerobic exercise 3 times a week. Aerobic exercises include walking, biking, and swimming. Aerobic exercise along with a healthy diet can help you maintain a healthy weight.  Changes may also  include quitting smoking. Medicines  Medicines are usually given if diet and lifestyle changes have failed to reduce your cholesterol to healthy levels.  Your health care provider may prescribe a statin medicine. Statin medicines have been shown to reduce cholesterol, which can reduce the risk of heart disease. Follow these instructions at home: Eating and drinking If told by your health care provider:  Eat chicken (without skin), fish, veal, shellfish, ground [place] breast, and round or loin cuts of red meat.  Do not eat fried foods or fatty meats, such as hot dogs and salami.  Eat plenty of fruits, such as apples.  Eat plenty of vegetables, such as broccoli, potatoes, and carrots.  Eat beans, peas, and lentils.  Eat grains such as barley, rice, couscous, and bulgur wheat.  Eat pasta without cream sauces.  Use skim or nonfat milk, and eat low-fat or nonfat yogurt and cheeses.  Do not eat or drink whole milk, cream, ice cream, egg yolks, or hard cheeses.  Do not eat stick margarine or tub margarines that contain trans fats (also called partially hydrogenated oils).  Do not eat saturated tropical oils, such as coconut oil and palm oil.  Do not eat cakes, cookies, crackers, or other baked goods that contain trans fats.  General instructions  Exercise as directed by your health care provider. Increase your activity level with activities such as gardening, walking, and taking the stairs.  Take over-the-counter and prescription medicines only as told by your health care provider.  Do not use any products that contain nicotine or tobacco, such as cigarettes and e-cigarettes. If you need help quitting, ask your health care provider.  Keep all follow-up visits as told by your health care provider. This is important. Contact a health care provider if:  You are struggling to maintain a healthy diet or weight.  You need help to start on an exercise program.  You need help to  stop smoking. Get help right away if:  You have chest pain.  You have trouble breathing. This information is not intended to replace advice given to you by your health care provider. Make sure you discuss any questions you have with your health care provider. Document Revised: 09/18/2017 Document Reviewed: 03/15/2016 Elsevier Patient Education  2020 [employer].

## 2020-03-07 ENCOUNTER — Other Ambulatory Visit: Payer: Self-pay

## 2020-03-07 ENCOUNTER — Other Ambulatory Visit (INDEPENDENT_AMBULATORY_CARE_PROVIDER_SITE_OTHER): Payer: BC Managed Care – PPO

## 2020-03-07 ENCOUNTER — Other Ambulatory Visit: Payer: BC Managed Care – PPO

## 2020-03-07 DIAGNOSIS — E871 Hypo-osmolality and hyponatremia: Secondary | ICD-10-CM

## 2020-03-07 DIAGNOSIS — D696 Thrombocytopenia, unspecified: Secondary | ICD-10-CM

## 2020-03-07 LAB — CBC WITH DIFFERENTIAL/PLATELET
Basophils Absolute: 0 10*3/uL (ref 0.0–0.1)
Basophils Relative: 0.6 % (ref 0.0–3.0)
Eosinophils Absolute: 0.1 10*3/uL (ref 0.0–0.7)
Eosinophils Relative: 3.3 % (ref 0.0–5.0)
HCT: 41.7 % (ref 39.0–52.0)
Hemoglobin: 13.5 g/dL (ref 13.0–17.0)
Lymphocytes Relative: 54.2 % — ABNORMAL HIGH (ref 12.0–46.0)
Lymphs Abs: 1.6 10*3/uL (ref 0.7–4.0)
MCHC: 32.3 g/dL (ref 30.0–36.0)
MCV: 91.1 fl (ref 78.0–100.0)
Monocytes Absolute: 0.3 10*3/uL (ref 0.1–1.0)
Monocytes Relative: 10.1 % (ref 3.0–12.0)
Neutro Abs: 0.9 10*3/uL — ABNORMAL LOW (ref 1.4–7.7)
Neutrophils Relative %: 31.8 % — ABNORMAL LOW (ref 43.0–77.0)
Platelets: 232 10*3/uL (ref 150.0–400.0)
RBC: 4.58 Mil/uL (ref 4.22–5.81)
RDW: 12.4 % (ref 11.5–15.5)
WBC: 3 10*3/uL — ABNORMAL LOW (ref 4.0–10.5)

## 2020-03-07 LAB — COMPREHENSIVE METABOLIC PANEL
ALT: 15 U/L (ref 0–53)
AST: 15 U/L (ref 0–37)
Albumin: 4.6 g/dL (ref 3.5–5.2)
Alkaline Phosphatase: 58 U/L (ref 39–117)
BUN: 21 mg/dL (ref 6–23)
CO2: 30 mEq/L (ref 19–32)
Calcium: 10 mg/dL (ref 8.4–10.5)
Chloride: 101 mEq/L (ref 96–112)
Creatinine, Ser: 1.21 mg/dL (ref 0.40–1.50)
GFR: 77.82 mL/min (ref >60.00)
Glucose, Bld: 81 mg/dL (ref 70–99)
Potassium: 4.3 mEq/L (ref 3.5–5.1)
Sodium: 138 mEq/L (ref 135–145)
Total Bilirubin: 1.4 mg/dL — ABNORMAL HIGH (ref 0.2–1.2)
Total Protein: 7.1 g/dL (ref 6.0–8.3)

## 2020-05-30 ENCOUNTER — Ambulatory Visit: Payer: BC Managed Care – PPO | Admitting: Nurse Practitioner

## 2020-07-19 IMAGING — MR MR BRAIN/IAC WO/W CM
10 of 14 series · 27 of 48 positions shown · IV contrast (gadavist)
Comparison: None.

CLINICAL DATA: Asymmetric left sensorineural hearing loss

EXAM:
MRI HEAD WITHOUT AND WITH CONTRAST
TECHNIQUE: Multiplanar, multiecho pulse sequences of the brain and surrounding
structures were obtained without and with intravenous contrast.
CONTRAST:  10mL GADAVIST GADOBUTROL 1 MMOL/ML IV SOLN

[Series 5: T1 · sagittal · 5.0mm · 0.62mm/px · 3 of 25 slices shown (1 of 3)]
[im 1/25]
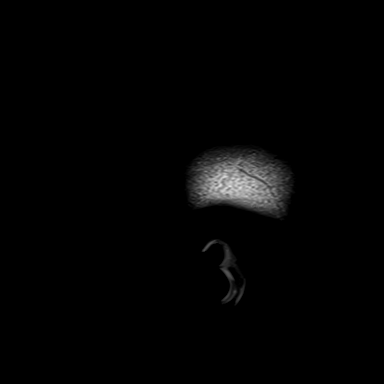
[im 13/25]
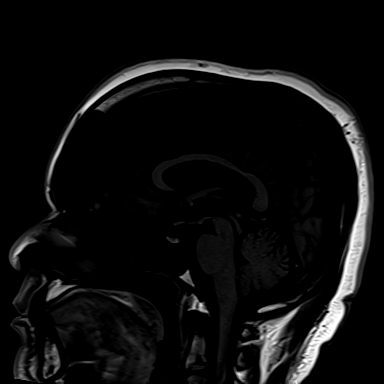
[im 25/25]
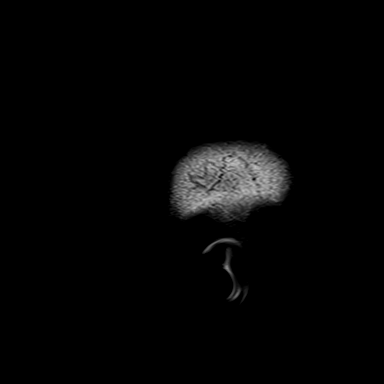

[Series 6: ax dwi_tracew · axial · 3.0mm · 0.60mm/px · z∈[-106,+49]mm · 3 of 48 slices shown]
[im 1/48]
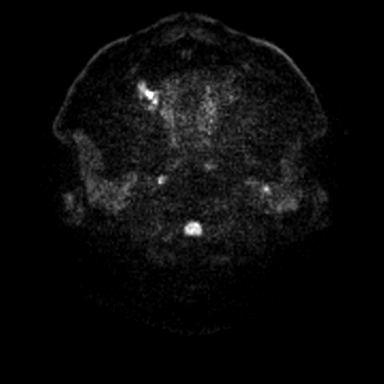
[im 24/48]
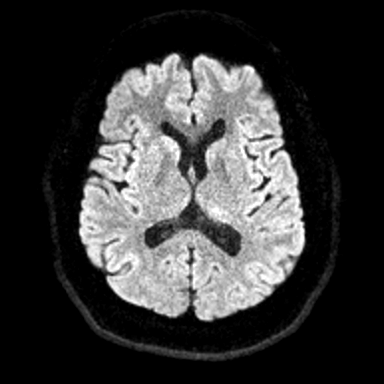
[im 48/48]
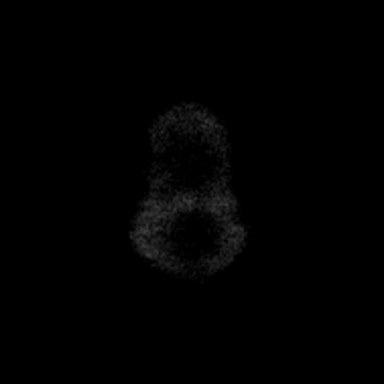

[Series 7: ax dwi_adc · axial · 3.0mm · 0.60mm/px · z∈[-106,-30]mm · 2 of 48 slices shown]
[im 1/48]
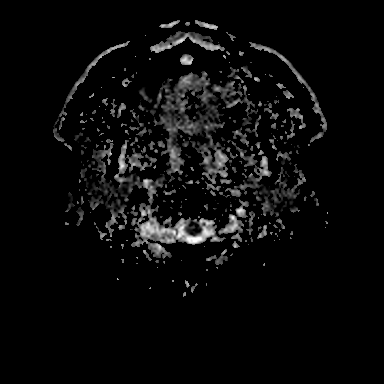
[im 24/48]
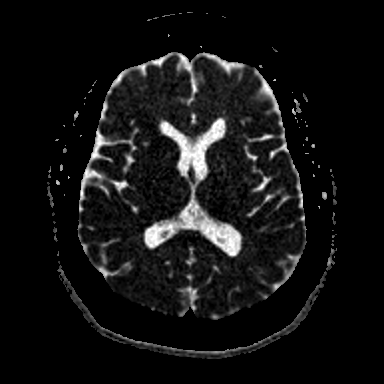

[Series 8: T2 · axial · 5.0mm · 0.53mm/px · z∈[-98,+46]mm · 2 of 25 slices shown]
[im 1/25]
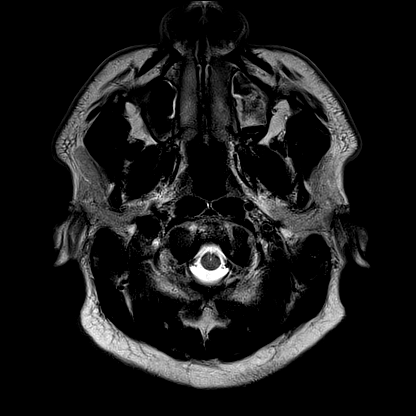
[im 25/25]
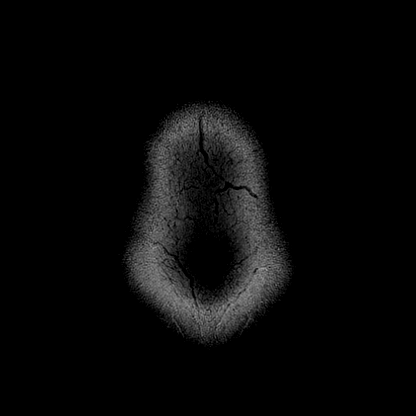

[Series 13: FLAIR · axial · 3.0mm · 0.53mm/px · z∈[-107,+55]mm · 4 of 55 slices shown]
[im 1/55]
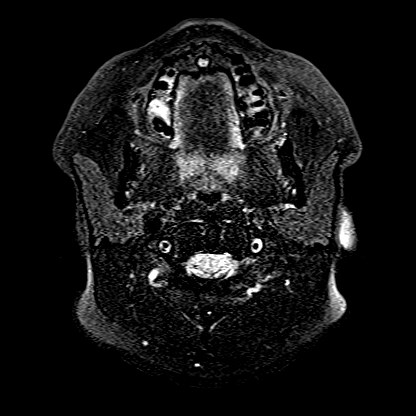
[im 19/55]
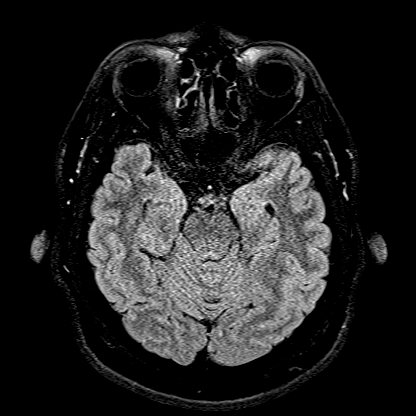
[im 37/55]
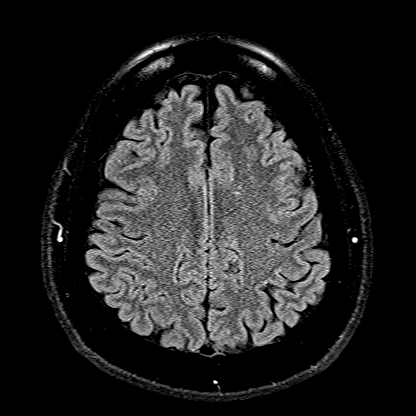
[im 55/55]
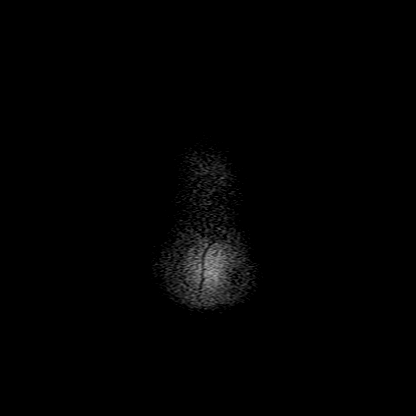

[Series 14: T1 · coronal · non-contrast · 3.0mm · 0.21mm/px · 1 of 13 slices shown (2 of 3)]
[im 1/13]
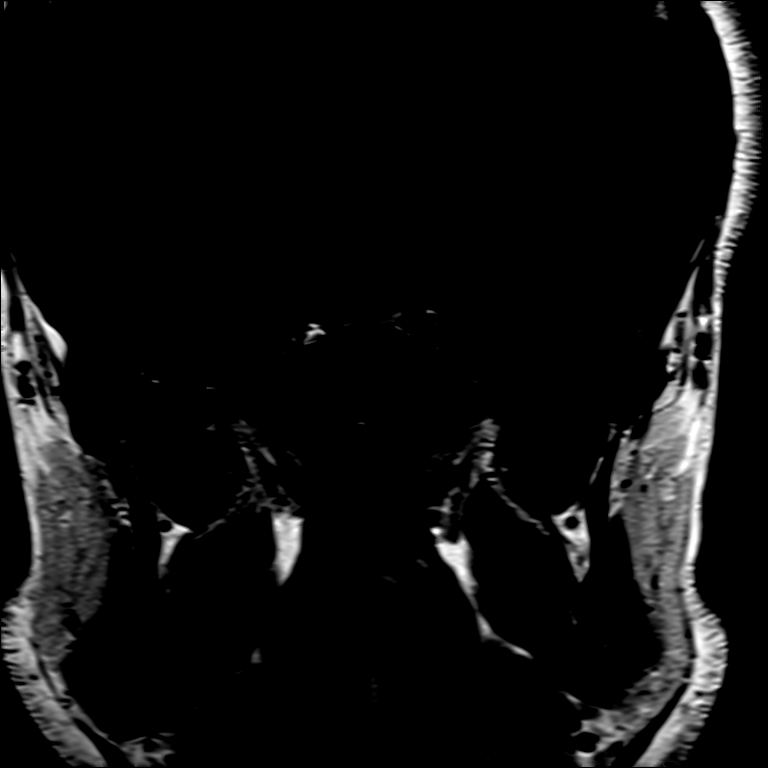

[Series 16: T1 · axial · non-contrast · 3.0mm · 0.21mm/px · 1 of 15 slices shown (3 of 3)]
[im 1/15]
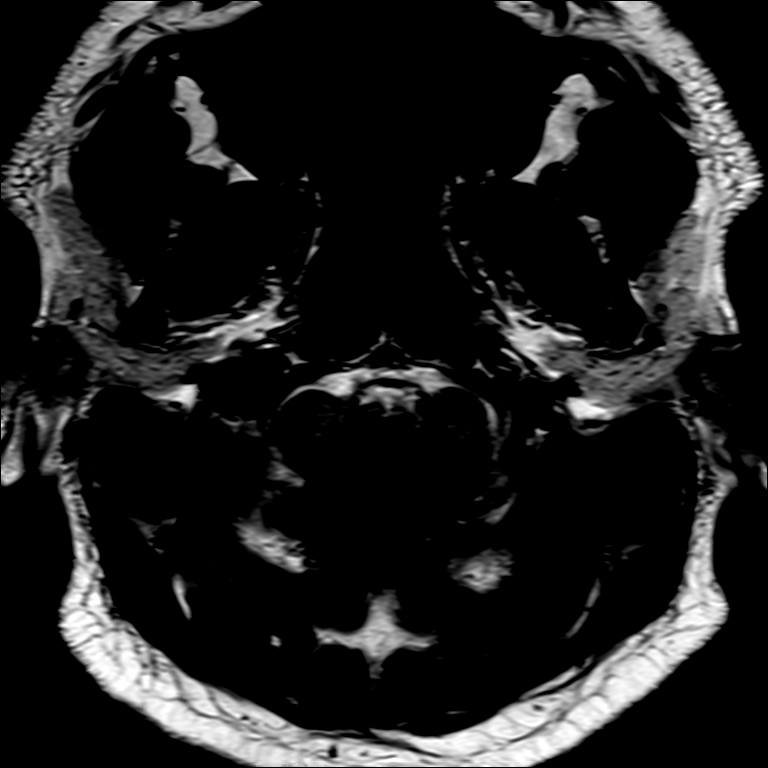

[Series 17: T1 post-contrast · axial · 3.0mm · 0.21mm/px · 1 of 15 slices shown (1 of 3)]
[im 1/15]
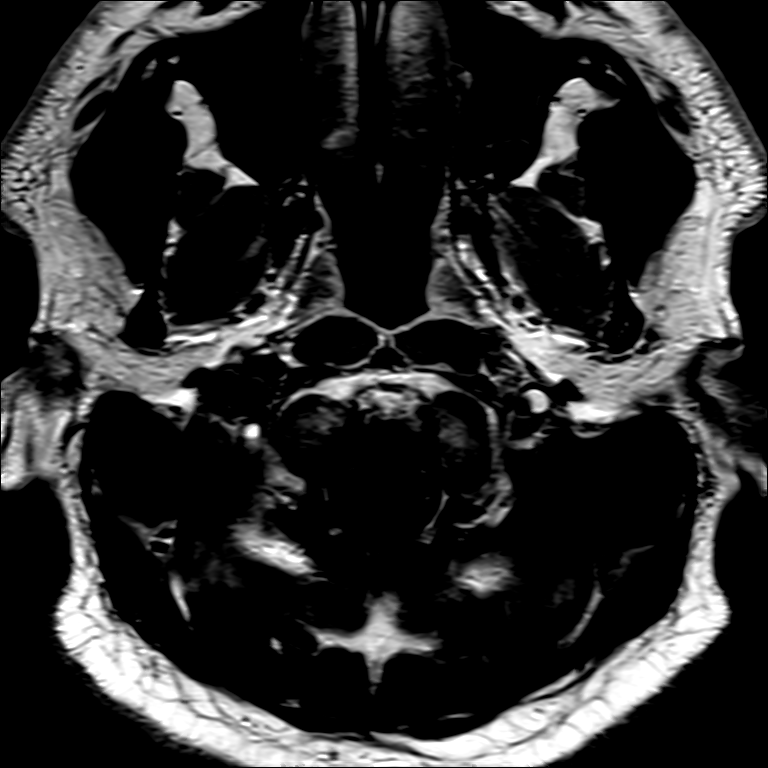

[Series 18: T1 post-contrast · coronal · 3.0mm · 0.21mm/px · 1 of 13 slices shown (2 of 3)]
[im 1/13]
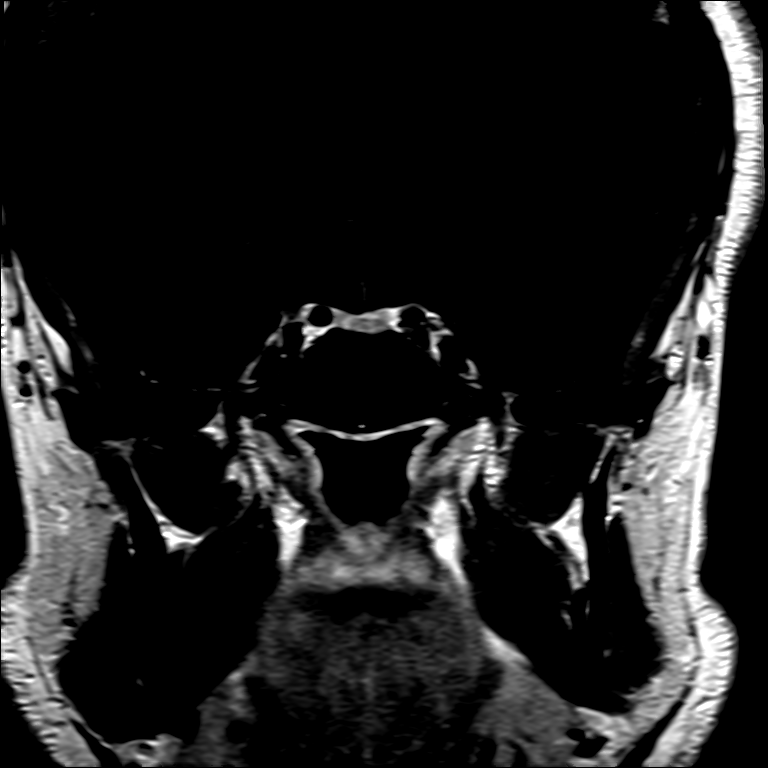

[Series 19: T1 post-contrast · axial · 1.0mm · 0.98mm/px · z∈[-114,+60]mm · 9 of 176 slices shown (3 of 3)]
[im 1/176]
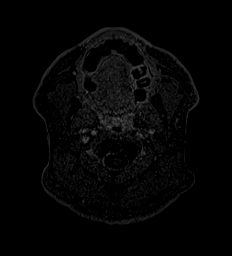
[im 30/176]
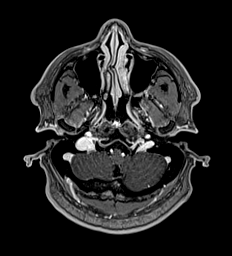
[im 59/176]
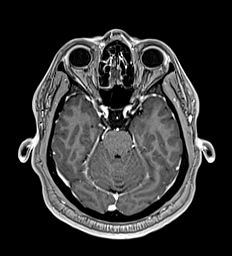
[im 73/176]
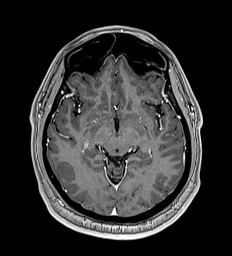
[im 88/176]
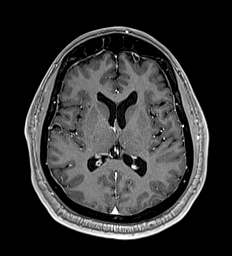
[im 103/176]
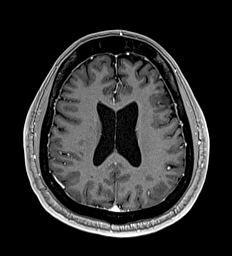
[im 117/176]
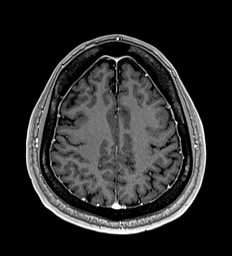
[im 146/176]
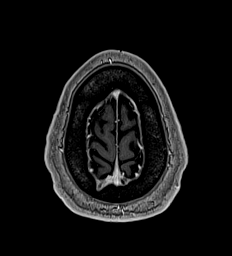
[im 176/176]
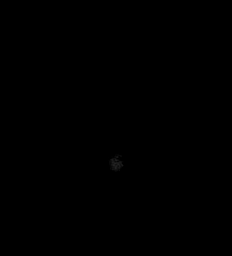

[27 of 48 positions shown; findings below may reference images not displayed]

FINDINGS: Brain: Symmetric normal labyrinthine signal. No abnormal enhancement
or masslike finding at the labyrinth, cisterns, or brainstem. No
white matter disease, infarct, hemorrhage, hydrocephalus, or
collection.

Vascular: Normal flow voids and vascular enhancements

Skull and upper cervical spine: There is enhancement at the left
maxillary alveolar ridge which is odontogenic appearing.

Sinuses/Orbits: Mild mucosal thickening on the floor of the left
maxillary sinus.
IMPRESSION: 1. Negative for retrocochlear lesion.
2. Odontogenic type inflammatory enhancement at the left upper first
molar with mild adjacent sinus mucosal thickening.

## 2020-08-13 ENCOUNTER — Other Ambulatory Visit: Payer: Self-pay | Admitting: Internal Medicine

## 2021-09-02 ENCOUNTER — Other Ambulatory Visit: Payer: Self-pay | Admitting: Internal Medicine

## 2022-09-11 ENCOUNTER — Other Ambulatory Visit: Payer: Self-pay | Admitting: Internal Medicine

## 2023-01-18 NOTE — Patient Instructions (Incomplete)
It was a pleasure meeting you today. Thank you for allowing me to take part in your health care.  Our goals for today as we discussed include:  Labs today Start Blood pressure medication today Check blood pressure at home.  Goal less than 130/80  Follow up in nurse clinic in 1 week for blood pressure check and repeat labs  Limit salt and caffeine intake Referral sent for colonoscopy  Recommend Shingles vaccine.  This is a 2 dose series and can be given at your local pharmacy.  Please talk to your pharmacist about this.  This will be due beginning 03/2023.  Recommend Tetanus Vaccination.  This is given every 10 years.   If you have any questions or concerns, please do not hesitate to call the office at [phone].  I look forward to our next visit and until then take care and stay safe.  Regards,   Dana Allan, MD   [facility]

## 2023-01-18 NOTE — Progress Notes (Signed)
   SUBJECTIVE:   Chief Complaint  Patient presents with   Transitions Of Care   HPI Patient presents to clinic to transfer care.  No acute concerns today.  Last seen in clinic 02/2020.  Hypertension Prescribed Zestoretic 20-12.5 mg daily.  Has run out of medication x 3 months.  Denies any headaches, visual changes, shortness of breath, chest pain or lower extremity edema.  Is requesting refills.     PERTINENT PMH / PSH: Hypertension Hyperlipidemia Obesity  Previous tobacco use.  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  Denies THC/heroin use  No previous surgeries.  OBJECTIVE:  BP (!) 150/98   Pulse 88   Temp 98.3 F (36.8 C)   Ht 5\' 6"  (1.676 m)   Wt 205 lb 1.9 oz (93 kg)   SpO2 97%   BMI 33.11 kg/m    Physical Exam Vitals reviewed.  Constitutional:      General: He is not in acute distress.    Appearance: He is obese. He is not ill-appearing.  HENT:     Head: Normocephalic.  Eyes:     Conjunctiva/sclera: Conjunctivae normal.  Neck:     Thyroid: No thyromegaly or thyroid tenderness.  Cardiovascular:     Rate and Rhythm: Normal rate and regular rhythm.     Pulses: Normal pulses.  Pulmonary:     Effort: Pulmonary effort is normal.     Breath sounds: Normal breath sounds.  Abdominal:     General: Bowel sounds are normal.  Neurological:     Mental Status: He is alert. Mental status is at baseline.  Psychiatric:        Mood and Affect: Mood normal.        Behavior: Behavior normal.        Thought Content: Thought content normal.        Judgment: Judgment normal.     ASSESSMENT/PLAN:  Essential hypertension Assessment & Plan: Chronic.  Elevated today and again on repeat check.  Uncontrolled likely due to no medications in 3 months.  Asymptomatic. Check c-Met Refill Zestoretic 20-12.5 mg daily Limit salt intake Limit caffeine intake Follow-up in 1 week RN clinic for repeat BP Repeat  bmet in 1 week.   Orders: -     CBC with Differential/Platelet -     Comprehensive metabolic panel -     Vitamin B12 -     VITAMIN D 25 Hydroxy (Vit-D Deficiency, Fractures) -     TSH -     Lisinopril-hydroCHLOROthiazide; Take 1 tablet by mouth daily.  Dispense: 30 tablet; Refill: 0 -     Basic metabolic panel; Future  Colon cancer screening -     Cologuard; Future  Hyperlipidemia, unspecified hyperlipidemia type -     Lipid panel  Need for hepatitis C screening test -     Hepatitis C antibody  Encounter for screening for HIV -     HIV Antibody (routine testing w rflx)  Class 1 obesity with serious comorbidity and body mass index (BMI) of 33.0 to 33.9 in adult, unspecified obesity type -     Hemoglobin A1c  HCM Cologuard referral sent Tdap due.  Declined today. Hep C/HIV screening labs today   PDMP reviewed  Return in about 1 week (around 01/26/2023) for blood pressure, RN clinic.  Dana Allan, MD

## 2023-01-19 ENCOUNTER — Ambulatory Visit: Payer: BC Managed Care – PPO | Admitting: Family Medicine

## 2023-01-19 ENCOUNTER — Encounter: Payer: Self-pay | Admitting: Family Medicine

## 2023-01-19 VITALS — BP 150/98 | HR 88 | Temp 98.3°F | Ht 66.0 in | Wt 205.1 lb

## 2023-01-19 DIAGNOSIS — Z0001 Encounter for general adult medical examination with abnormal findings: Secondary | ICD-10-CM | POA: Insufficient documentation

## 2023-01-19 DIAGNOSIS — E785 Hyperlipidemia, unspecified: Secondary | ICD-10-CM

## 2023-01-19 DIAGNOSIS — Z1211 Encounter for screening for malignant neoplasm of colon: Secondary | ICD-10-CM | POA: Diagnosis not present

## 2023-01-19 DIAGNOSIS — E669 Obesity, unspecified: Secondary | ICD-10-CM

## 2023-01-19 DIAGNOSIS — Z114 Encounter for screening for human immunodeficiency virus [HIV]: Secondary | ICD-10-CM | POA: Insufficient documentation

## 2023-01-19 DIAGNOSIS — Z1159 Encounter for screening for other viral diseases: Secondary | ICD-10-CM | POA: Diagnosis not present

## 2023-01-19 DIAGNOSIS — I10 Essential (primary) hypertension: Secondary | ICD-10-CM

## 2023-01-19 DIAGNOSIS — Z6833 Body mass index (BMI) 33.0-33.9, adult: Secondary | ICD-10-CM | POA: Diagnosis not present

## 2023-01-19 LAB — CBC WITH DIFFERENTIAL/PLATELET
Basophils Absolute: 0 10*3/uL (ref 0.0–0.1)
Basophils Relative: 0.7 % (ref 0.0–3.0)
Eosinophils Absolute: 0.1 10*3/uL (ref 0.0–0.7)
Eosinophils Relative: 2.2 % (ref 0.0–5.0)
HCT: 40.6 % (ref 39.0–52.0)
Hemoglobin: 13.4 g/dL (ref 13.0–17.0)
Lymphocytes Relative: 43.4 % (ref 12.0–46.0)
Lymphs Abs: 1.6 10*3/uL (ref 0.7–4.0)
MCHC: 33.1 g/dL (ref 30.0–36.0)
MCV: 89.8 fl (ref 78.0–100.0)
Monocytes Absolute: 0.4 10*3/uL (ref 0.1–1.0)
Monocytes Relative: 11.4 % (ref 3.0–12.0)
Neutro Abs: 1.6 10*3/uL (ref 1.4–7.7)
Neutrophils Relative %: 42.3 % — ABNORMAL LOW (ref 43.0–77.0)
Platelets: 253 10*3/uL (ref 150.0–400.0)
RBC: 4.52 Mil/uL (ref 4.22–5.81)
RDW: 11.9 % (ref 11.5–15.5)
WBC: 3.7 10*3/uL — ABNORMAL LOW (ref 4.0–10.5)

## 2023-01-19 LAB — COMPREHENSIVE METABOLIC PANEL
ALT: 19 U/L (ref 0–53)
AST: 16 U/L (ref 0–37)
Albumin: 4.7 g/dL (ref 3.5–5.2)
Alkaline Phosphatase: 53 U/L (ref 39–117)
BUN: 15 mg/dL (ref 6–23)
CO2: 30 mEq/L (ref 19–32)
Calcium: 9.9 mg/dL (ref 8.4–10.5)
Chloride: 104 mEq/L (ref 96–112)
Creatinine, Ser: 1.18 mg/dL (ref 0.40–1.50)
GFR: 72.34 mL/min (ref >60.00)
Glucose, Bld: 76 mg/dL (ref 70–99)
Potassium: 3.9 mEq/L (ref 3.5–5.1)
Sodium: 142 mEq/L (ref 135–145)
Total Bilirubin: 0.7 mg/dL (ref 0.2–1.2)
Total Protein: 7.2 g/dL (ref 6.0–8.3)

## 2023-01-19 LAB — LIPID PANEL
Cholesterol: 211 mg/dL — ABNORMAL HIGH (ref 0–200)
HDL: 46.8 mg/dL (ref >39.00)
LDL Cholesterol: 140 mg/dL — ABNORMAL HIGH (ref 0–99)
NonHDL: 164.21
Total CHOL/HDL Ratio: 5
Triglycerides: 121 mg/dL (ref 0.0–149.0)
VLDL: 24.2 mg/dL (ref 0.0–40.0)

## 2023-01-19 LAB — TSH: TSH: 1.6 u[IU]/mL (ref 0.35–5.50)

## 2023-01-19 LAB — HEMOGLOBIN A1C: Hgb A1c MFr Bld: 4.7 % (ref 4.6–6.5)

## 2023-01-19 LAB — VITAMIN D 25 HYDROXY (VIT D DEFICIENCY, FRACTURES): VITD: 41.35 ng/mL (ref 30.00–100.00)

## 2023-01-19 LAB — VITAMIN B12: Vitamin B-12: 1199 pg/mL — ABNORMAL HIGH (ref 211–911)

## 2023-01-19 MED ORDER — LISINOPRIL-HYDROCHLOROTHIAZIDE 20-12.5 MG PO TABS
1.0000 | ORAL_TABLET | Freq: Every day | ORAL | 0 refills | Status: DC
Start: 2023-01-19 — End: 2023-02-24

## 2023-01-20 LAB — HEPATITIS C ANTIBODY: Hepatitis C Ab: NONREACTIVE

## 2023-01-20 LAB — HIV ANTIBODY (ROUTINE TESTING W REFLEX): HIV 1&2 Ab, 4th Generation: NONREACTIVE

## 2023-01-25 ENCOUNTER — Encounter: Payer: Self-pay | Admitting: Family Medicine

## 2023-01-25 NOTE — Assessment & Plan Note (Addendum)
Chronic.  Elevated today and again on repeat check.  Uncontrolled likely due to no medications in 3 months.  Asymptomatic. Check c-Met Refill Zestoretic 20-12.5 mg daily Limit salt intake Limit caffeine intake Follow-up in 1 week RN clinic for repeat BP Repeat bmet in 1 week.

## 2023-01-27 ENCOUNTER — Ambulatory Visit: Payer: BC Managed Care – PPO

## 2023-01-27 DIAGNOSIS — I10 Essential (primary) hypertension: Secondary | ICD-10-CM

## 2023-01-27 NOTE — Progress Notes (Signed)
Patient presented for BP check I check pt Bp in his  left arm, patient BP was 122/82 O2 was 99 Hr was 90.

## 2023-01-28 ENCOUNTER — Telehealth: Payer: Self-pay | Admitting: Internal Medicine

## 2023-01-28 NOTE — Telephone Encounter (Signed)
Nicholas Madden came in for bp check.  I signed off on the note, but wanted you to have the information.  CMA note:  "Patient presented for BP check I check pt Bp in his  left arm, patient BP was 122/82 O2 was 99 Hr was 90. " Of note, I reviewed your last note, it appears he was just started back on bp medication.  You had wanted a f/u met b in one week.  It does not appear this was drawn.  Just wanted you to know, in case you wanted to contact regarding f/u lab.   Thanks  [employer]

## 2023-01-30 NOTE — Telephone Encounter (Signed)
Pt has been scheduled for lab 5/7

## 2023-01-30 NOTE — Telephone Encounter (Signed)
Left voicemail to return call & sent mychart message

## 2023-02-03 ENCOUNTER — Other Ambulatory Visit (INDEPENDENT_AMBULATORY_CARE_PROVIDER_SITE_OTHER): Payer: BC Managed Care – PPO

## 2023-02-03 DIAGNOSIS — I10 Essential (primary) hypertension: Secondary | ICD-10-CM

## 2023-02-03 LAB — BASIC METABOLIC PANEL
BUN: 14 mg/dL (ref 6–23)
CO2: 29 mEq/L (ref 19–32)
Calcium: 9.5 mg/dL (ref 8.4–10.5)
Chloride: 102 mEq/L (ref 96–112)
Creatinine, Ser: 1.2 mg/dL (ref 0.40–1.50)
GFR: 70.87 mL/min (ref >60.00)
Glucose, Bld: 90 mg/dL (ref 70–99)
Potassium: 4 mEq/L (ref 3.5–5.1)
Sodium: 138 mEq/L (ref 135–145)

## 2023-02-17 ENCOUNTER — Telehealth: Payer: BC Managed Care – PPO | Admitting: Family Medicine

## 2023-02-19 ENCOUNTER — Telehealth: Payer: BC Managed Care – PPO | Admitting: Family Medicine

## 2023-02-24 ENCOUNTER — Other Ambulatory Visit: Payer: Self-pay | Admitting: Family Medicine

## 2023-02-24 DIAGNOSIS — I10 Essential (primary) hypertension: Secondary | ICD-10-CM

## 2023-02-26 ENCOUNTER — Other Ambulatory Visit: Payer: Self-pay | Admitting: Family Medicine

## 2023-02-26 DIAGNOSIS — I10 Essential (primary) hypertension: Secondary | ICD-10-CM

## 2023-02-26 MED ORDER — LISINOPRIL-HYDROCHLOROTHIAZIDE 20-12.5 MG PO TABS
1.0000 | ORAL_TABLET | Freq: Every day | ORAL | 0 refills | Status: DC
Start: 2023-02-26 — End: 2023-03-11

## 2023-03-11 ENCOUNTER — Encounter: Payer: Self-pay | Admitting: Family Medicine

## 2023-03-11 ENCOUNTER — Telehealth (INDEPENDENT_AMBULATORY_CARE_PROVIDER_SITE_OTHER): Payer: BC Managed Care – PPO | Admitting: Family Medicine

## 2023-03-11 DIAGNOSIS — E785 Hyperlipidemia, unspecified: Secondary | ICD-10-CM

## 2023-03-11 DIAGNOSIS — I10 Essential (primary) hypertension: Secondary | ICD-10-CM

## 2023-03-11 MED ORDER — ROSUVASTATIN CALCIUM 10 MG PO TABS
10.0000 mg | ORAL_TABLET | Freq: Every day | ORAL | 3 refills | Status: DC
Start: 2023-03-11 — End: 2024-03-20

## 2023-03-11 MED ORDER — LISINOPRIL-HYDROCHLOROTHIAZIDE 20-12.5 MG PO TABS
1.0000 | ORAL_TABLET | Freq: Every day | ORAL | 3 refills | Status: DC
Start: 2023-03-11 — End: 2024-03-20

## 2023-03-11 NOTE — Progress Notes (Signed)
Virtual Visit via Video note  I connected with Heather A Monter on 03/21/23 at 1549 by video and verified that I am speaking with the correct person using two identifiers. Rondale A Lucero is currently located at home and  is currently alone during visit. The provider, Dana Allan, MD is located in their office at time of visit.  I discussed the limitations, risks, security and privacy concerns of performing an evaluation and management service by video and the availability of in person appointments. I also discussed with the patient that there may be a patient responsible charge related to this service. The patient expressed understanding and agreed to proceed.  Subjective: PCP: Dana Allan, MD  Chief Complaint  Patient presents with   Medical Management of Chronic Issues    HPI Presents to discuss chronic disease management  Hypertension Asymptomatic.  BP at home 126/76.  Doing much better since restarting BP medication.  Tolerating Zestoretic 20-12.5 mg daily.  Denies any headaches, visual changes, chest pain, shortness of breath or lower extremity edema.    Hyperlipidemia. Recent LDL elevated.  Discussed initiating statin therapy given increase ASCVD risk.  Agreeable to medication.   Rash on thigh Patient requesting medication for rash on posterior thigh.  Symptoms started 2 months ago.  Increased itching.  Tried cortisone OTC without relief.  Discussed limitation of video visit and decreased visibility to assess rash.  Patient agreeable to follow up tomorrow in clinic for evaluation.  Appointment scheduled.   ROS: Per HPI  Current Outpatient Medications:    Multiple Vitamin (ONE-A-DAY MENS PO), Take by mouth., Disp: , Rfl:    rosuvastatin (CRESTOR) 10 MG tablet, Take 1 tablet (10 mg total) by mouth daily., Disp: 90 tablet, Rfl: 3   lisinopril-hydrochlorothiazide (ZESTORETIC) 20-12.5 MG tablet, Take 1 tablet by mouth daily., Disp: 90 tablet, Rfl: 3   triamcinolone cream (KENALOG)  0.1 %, Apply 1 Application topically 2 (two) times daily., Disp: 30 g, Rfl: 0  Observations/Objective: Physical Exam Vitals reviewed.  Constitutional:      General: He is not in acute distress.    Appearance: Normal appearance. He is not toxic-appearing.  Eyes:     Conjunctiva/sclera: Conjunctivae normal.  Pulmonary:     Effort: Pulmonary effort is normal.  Neurological:     Mental Status: He is alert. Mental status is at baseline.  Psychiatric:        Mood and Affect: Mood normal.        Behavior: Behavior normal.        Thought Content: Thought content normal.        Judgment: Judgment normal.   The 10-year ASCVD risk score (Arnett DK, et al., 2019) is: 7.9%   Values used to calculate the score:     Age: 6 years     Sex: Male     Is Non-Hispanic African American: Yes     Diabetic: No     Tobacco smoker: No     Systolic Blood Pressure: 122 mmHg     Is BP treated: Yes     HDL Cholesterol: 46.8 mg/dL     Total Cholesterol: 211 mg/dL    Assessment and Plan: Essential hypertension Assessment & Plan: Chronic.  Well controlled since restarting BP medication Continue Zestoretic 20-12.5 mg daily Limit salt intake Limit caffeine intake    Orders: -     Lisinopril-hydroCHLOROthiazide; Take 1 tablet by mouth daily.  Dispense: 90 tablet; Refill: 3  Hyperlipidemia, unspecified hyperlipidemia type Assessment & Plan:  The 10-year ASCVD risk score (Arnett DK, et al., 2019) is: 7.9%  Start Crestor 10 mg daily Encouraged healthy diet and increase activity Check lipids annually  Orders: -     Rosuvastatin Calcium; Take 1 tablet (10 mg total) by mouth daily.  Dispense: 90 tablet; Refill: 3    Follow Up Instructions: Return in about 1 day (around 03/12/2023).   I discussed the assessment and treatment plan with the patient. The patient was provided an opportunity to ask questions and all were answered. The patient agreed with the plan and demonstrated an understanding of the  instructions.   The patient was advised to call back or seek an in-person evaluation if the symptoms worsen or if the condition fails to improve as anticipated.  The above assessment and management plan was discussed with the patient. The patient verbalized understanding of and has agreed to the management plan. Patient is aware to call the clinic if symptoms persist or worsen. Patient is aware when to return to the clinic for a follow-up visit. Patient educated on when it is appropriate to go to the emergency department.     Dana Allan, MD

## 2023-03-12 ENCOUNTER — Ambulatory Visit: Payer: BC Managed Care – PPO | Admitting: Family Medicine

## 2023-03-12 ENCOUNTER — Encounter: Payer: Self-pay | Admitting: Family Medicine

## 2023-03-12 VITALS — BP 122/88 | HR 92 | Temp 98.4°F | Ht 66.0 in | Wt 211.6 lb

## 2023-03-12 DIAGNOSIS — L309 Dermatitis, unspecified: Secondary | ICD-10-CM | POA: Diagnosis not present

## 2023-03-12 MED ORDER — TRIAMCINOLONE ACETONIDE 0.1 % EX CREA
1.0000 | TOPICAL_CREAM | Freq: Two times a day (BID) | CUTANEOUS | 0 refills | Status: DC
Start: 2023-03-12 — End: 2023-07-12

## 2023-03-12 NOTE — Patient Instructions (Signed)
It was a pleasure meeting you today. Thank you for allowing me to take part in your health care.  Our goals for today as we discussed include:  Use Triamcinolone cream two times a day for 5 days and then as needed  If you have any questions or concerns, please do not hesitate to call the office at [phone].  I look forward to our next visit and until then take care and stay safe.  Regards,   Dana Allan, MD   [facility]

## 2023-03-12 NOTE — Progress Notes (Signed)
   SUBJECTIVE:   Chief Complaint  Patient presents with   Rash    Rash is located on back of right thigh. Rash is itchy. Pt has tried hydrocortisone but it has not helped.    HPI Presents to clinic for rash on right posterior thigh.  Symptoms started 2 months ago.  Itching, scaling rash.  Has had similar symptoms in past.  Tried OTC cortisone without relief.  Denies any fevers, blisters, swimming, sun bathing, change in environment. No one else in household with similar symptoms  PERTINENT PMH / PSH: HTN Obesity Class 1  OBJECTIVE:  BP 122/88   Pulse 92   Temp 98.4 F (36.9 C) (Oral)   Ht 5\' 6"  (1.676 m)   Wt 211 lb 9.6 oz (96 kg)   SpO2 98%   BMI 34.15 kg/m    Physical Exam Skin:    Findings: Erythema and rash present. Rash is scaling. Rash is not crusting, pustular, urticarial or vesicular.     ASSESSMENT/PLAN:  Dermatitis Assessment & Plan: Itchy scaling rash on posterior right thigh. Trial Triamcinolone cream BID If not improvement f/u MD  Orders: -     Triamcinolone Acetonide; Apply 1 Application topically 2 (two) times daily.  Dispense: 30 g; Refill: 0   PDMP reviewed  No follow-ups on file.  Dana Allan, MD

## 2023-03-21 ENCOUNTER — Encounter: Payer: Self-pay | Admitting: Family Medicine

## 2023-03-21 DIAGNOSIS — L309 Dermatitis, unspecified: Secondary | ICD-10-CM | POA: Insufficient documentation

## 2023-03-21 NOTE — Patient Instructions (Signed)
It was a pleasure meeting you today. Thank you for allowing me to take part in your health care.  Our goals for today as we discussed include:  Continue current medication for blood pressure  Start Crestor 10 mg once every evening for high cholesterol Increase activity and monitor diet to lower high cholesterol containing foods   If you have any questions or concerns, please do not hesitate to call the office at [phone].  I look forward to our next visit and until then take care and stay safe.  Regards,   Dana Allan, MD   [facility]

## 2023-03-21 NOTE — Assessment & Plan Note (Signed)
The 10-year ASCVD risk score (Arnett DK, et al., 2019) is: 7.9%  Start Crestor 10 mg daily Encouraged healthy diet and increase activity Check lipids annually

## 2023-03-21 NOTE — Assessment & Plan Note (Signed)
Itchy scaling rash on posterior right thigh. Trial Triamcinolone cream BID If not improvement f/u MD

## 2023-03-21 NOTE — Assessment & Plan Note (Signed)
Chronic.  Well controlled since restarting BP medication Continue Zestoretic 20-12.5 mg daily Limit salt intake Limit caffeine intake

## 2023-05-18 ENCOUNTER — Other Ambulatory Visit: Payer: Self-pay | Admitting: Oncology

## 2023-05-18 DIAGNOSIS — Z006 Encounter for examination for normal comparison and control in clinical research program: Secondary | ICD-10-CM

## 2023-06-07 ENCOUNTER — Other Ambulatory Visit
Admission: RE | Admit: 2023-06-07 | Discharge: 2023-06-07 | Disposition: A | Discharge status: Home / Self Care | Payer: BC Managed Care – PPO | Attending: Oncology | Admitting: Oncology

## 2023-06-07 DIAGNOSIS — Z006 Encounter for examination for normal comparison and control in clinical research program: Secondary | ICD-10-CM | POA: Insufficient documentation

## 2023-06-08 NOTE — Group Note (Deleted)

## 2023-06-16 LAB — GENECONNECT MOLECULAR SCREEN: Genetic Analysis Overall Interpretation: NEGATIVE

## 2023-07-05 ENCOUNTER — Encounter: Payer: Self-pay | Admitting: Family Medicine

## 2023-07-10 ENCOUNTER — Other Ambulatory Visit: Payer: Self-pay | Admitting: Family Medicine

## 2023-07-10 DIAGNOSIS — L309 Dermatitis, unspecified: Secondary | ICD-10-CM

## 2023-11-26 ENCOUNTER — Other Ambulatory Visit: Payer: Self-pay | Admitting: Family Medicine

## 2023-11-26 DIAGNOSIS — L309 Dermatitis, unspecified: Secondary | ICD-10-CM

## 2024-03-17 ENCOUNTER — Other Ambulatory Visit: Payer: Self-pay | Admitting: Family Medicine

## 2024-03-17 DIAGNOSIS — E785 Hyperlipidemia, unspecified: Secondary | ICD-10-CM

## 2024-03-17 DIAGNOSIS — I10 Essential (primary) hypertension: Secondary | ICD-10-CM

## 2024-07-14 ENCOUNTER — Encounter: Payer: Self-pay | Admitting: Nurse Practitioner

## 2024-07-14 ENCOUNTER — Ambulatory Visit (INDEPENDENT_AMBULATORY_CARE_PROVIDER_SITE_OTHER): Payer: Self-pay | Admitting: Nurse Practitioner

## 2024-07-14 ENCOUNTER — Ambulatory Visit: Payer: Self-pay | Admitting: Nurse Practitioner

## 2024-07-14 VITALS — BP 120/82 | HR 62 | Temp 98.3°F | Ht 66.0 in | Wt 209.6 lb

## 2024-07-14 DIAGNOSIS — Z0001 Encounter for general adult medical examination with abnormal findings: Secondary | ICD-10-CM | POA: Diagnosis not present

## 2024-07-14 DIAGNOSIS — Z1329 Encounter for screening for other suspected endocrine disorder: Secondary | ICD-10-CM | POA: Diagnosis not present

## 2024-07-14 DIAGNOSIS — Z125 Encounter for screening for malignant neoplasm of prostate: Secondary | ICD-10-CM

## 2024-07-14 DIAGNOSIS — E669 Obesity, unspecified: Secondary | ICD-10-CM | POA: Diagnosis not present

## 2024-07-14 DIAGNOSIS — L309 Dermatitis, unspecified: Secondary | ICD-10-CM

## 2024-07-14 DIAGNOSIS — R351 Nocturia: Secondary | ICD-10-CM | POA: Diagnosis not present

## 2024-07-14 DIAGNOSIS — E785 Hyperlipidemia, unspecified: Secondary | ICD-10-CM

## 2024-07-14 DIAGNOSIS — Z1211 Encounter for screening for malignant neoplasm of colon: Secondary | ICD-10-CM

## 2024-07-14 DIAGNOSIS — I10 Essential (primary) hypertension: Secondary | ICD-10-CM | POA: Diagnosis not present

## 2024-07-14 LAB — CBC WITH DIFFERENTIAL/PLATELET
Basophils Absolute: 0 K/uL (ref 0.0–0.1)
Basophils Relative: 0.4 % (ref 0.0–3.0)
Eosinophils Absolute: 0.2 K/uL (ref 0.0–0.7)
Eosinophils Relative: 6.1 % — ABNORMAL HIGH (ref 0.0–5.0)
HCT: 43.8 % (ref 39.0–52.0)
Hemoglobin: 14.1 g/dL (ref 13.0–17.0)
Lymphocytes Relative: 32.8 % (ref 12.0–46.0)
Lymphs Abs: 1.3 K/uL (ref 0.7–4.0)
MCHC: 32.3 g/dL (ref 30.0–36.0)
MCV: 90.4 fl (ref 78.0–100.0)
Monocytes Absolute: 0.4 K/uL (ref 0.1–1.0)
Monocytes Relative: 9.9 % (ref 3.0–12.0)
Neutro Abs: 2 K/uL (ref 1.4–7.7)
Neutrophils Relative %: 50.8 % (ref 43.0–77.0)
Platelets: 220 K/uL (ref 150.0–400.0)
RBC: 4.85 Mil/uL (ref 4.22–5.81)
RDW: 12 % (ref 11.5–15.5)
WBC: 3.9 K/uL — ABNORMAL LOW (ref 4.0–10.5)

## 2024-07-14 LAB — VITAMIN D 25 HYDROXY (VIT D DEFICIENCY, FRACTURES): VITD: 36.69 ng/mL (ref 30.00–100.00)

## 2024-07-14 LAB — COMPREHENSIVE METABOLIC PANEL WITH GFR
ALT: 29 U/L (ref 0–53)
AST: 21 U/L (ref 0–37)
Albumin: 4.8 g/dL (ref 3.5–5.2)
Alkaline Phosphatase: 65 U/L (ref 39–117)
BUN: 15 mg/dL (ref 6–23)
CO2: 31 meq/L (ref 19–32)
Calcium: 9.7 mg/dL (ref 8.4–10.5)
Chloride: 102 meq/L (ref 96–112)
Creatinine, Ser: 1.32 mg/dL (ref 0.40–1.50)
GFR: 62.57 mL/min (ref >60.00)
Glucose, Bld: 89 mg/dL (ref 70–99)
Potassium: 4.2 meq/L (ref 3.5–5.1)
Sodium: 139 meq/L (ref 135–145)
Total Bilirubin: 1.4 mg/dL — ABNORMAL HIGH (ref 0.2–1.2)
Total Protein: 7.2 g/dL (ref 6.0–8.3)

## 2024-07-14 LAB — HEMOGLOBIN A1C: Hgb A1c MFr Bld: 5.1 % (ref 4.6–6.5)

## 2024-07-14 LAB — LIPID PANEL
Cholesterol: 167 mg/dL (ref 0–200)
HDL: 52 mg/dL (ref >39.00)
LDL Cholesterol: 96 mg/dL (ref 0–99)
NonHDL: 114.65
Total CHOL/HDL Ratio: 3
Triglycerides: 94 mg/dL (ref 0.0–149.0)
VLDL: 18.8 mg/dL (ref 0.0–40.0)

## 2024-07-14 LAB — TSH: TSH: 1.51 u[IU]/mL (ref 0.35–5.50)

## 2024-07-14 LAB — PSA: PSA: 1.12 ng/mL (ref 0.10–4.00)

## 2024-07-14 MED ORDER — TRIAMCINOLONE ACETONIDE 0.1 % EX CREA
TOPICAL_CREAM | Freq: Two times a day (BID) | CUTANEOUS | 2 refills | Status: AC | PRN
Start: 2024-07-14 — End: ?

## 2024-07-14 MED ORDER — ROSUVASTATIN CALCIUM 10 MG PO TABS
10.0000 mg | ORAL_TABLET | Freq: Every day | ORAL | 3 refills | Status: AC
Start: 2024-07-14 — End: ?

## 2024-07-14 MED ORDER — LISINOPRIL-HYDROCHLOROTHIAZIDE 20-12.5 MG PO TABS: 1.0000 | ORAL_TABLET | Freq: Every day | ORAL | 3 refills | Status: AC

## 2024-07-14 NOTE — Assessment & Plan Note (Signed)
 Hypertension is well-controlled with lisinopril  and hydrochlorothiazide , with blood pressure around 120/80 mmHg. Nocturia is likely due to diuretic timing. Continue lisinopril  and hydrochlorothiazide , advising him to take hydrochlorothiazide  earlier in the afternoon. Check CMP.

## 2024-07-14 NOTE — Assessment & Plan Note (Signed)
 Physical exam complete. We will check lab work as outlined. Colon cancer screening is due, options discussed. We will proceed with Cologuard, order placed. Encouraged to complete as soon as it arrives. Prostate screening ordered in lab work. Flu and tetanus vaccines are up to date. Declines additional COVID vaccines. He has not received the shingles vaccine; benefits and side effects were discussed. He will return for this or get at local pharmacy. He does not smoke, drink alcohol, or use drugs and maintains regular dental and eye care. Encourage continued healthy diet and regular exercise. Return to care in 6 months, sooner as needed.

## 2024-07-14 NOTE — Assessment & Plan Note (Signed)
 Nocturia is likely due to diuretic timing. Advise to take hydrochlorothiazide  earlier in the afternoon.

## 2024-07-14 NOTE — Assessment & Plan Note (Signed)
 The recurrent rash has resolved with Kenalog  cream, which has been effective. Differential diagnosis includes a fungal infection. Lotrisone was discussed as an alternative if the rash recurs and is suspected to be fungal. Refill Kenalog  cream and consider Lotrisone if needed.

## 2024-07-14 NOTE — Assessment & Plan Note (Signed)
 Check A1c. Encourage healthy diet and regular exercise.

## 2024-07-14 NOTE — Assessment & Plan Note (Signed)
 Hyperlipidemia is managed with rosuvastatin  without reported side effects. He maintains diet and exercise. Continue rosuvastatin . Check lipid panel.

## 2024-07-14 NOTE — Progress Notes (Signed)
 Nicholas Glance, NP-C Phone: [phone]  Nicholas Madden is a 53 y.o. male who presents today for transfer of care.   Discussed the use of AI scribe software for clinical note transcription with the patient, who gave verbal consent to proceed.  History of Present Illness   Nicholas Madden is a 53 year old male who presents for transfer of care and an annual physical exam.  He has a history of hypertension and hyperlipidemia, managed with lisinopril , hydrochlorothiazide , and rosuvastatin . His blood pressure readings are typically around 120/80 mmHg. He experiences frequent urination, attributed to diuretic use and high fluid intake, which disrupts his sleep as he wakes multiple times at night. He sleeps from 8:30 PM to 4:00 AM.  He experiences rashes on the thigh and buttock, and reports that they may be related to heat, sweating, or being a 'hairy person.' Kenalog  cream is used for relief, taking a couple of weeks to alleviate itching. He recalls a past effective treatment involving pills but has not tried other recent treatments.  He exercises three to four times a week and maintains a balanced diet with occasional 'cheat days'. He prefers cooking at home over eating out. No family history of colon or prostate cancer, only high blood pressure. He does not smoke, drink alcohol, or use drugs. Regular dental and eye check-ups are maintained, though an eye appointment is pending.      Social History   Tobacco Use  Smoking Status Former  Smokeless Tobacco Never  Tobacco Comments   Over 93yrs ago    Current Outpatient Medications on File Prior to Visit  Medication Sig Dispense Refill   Multiple Vitamin (ONE-A-DAY MENS PO) Take by mouth.     No current facility-administered medications on file prior to visit.     ROS see history of present illness  Objective  Physical Exam Vitals:   07/14/24 0943  BP: 120/82  Pulse: 62  Temp: 98.3 F (36.8 C)  SpO2: 99%    BP Readings from  Last 3 Encounters:  07/14/24 120/82  03/12/23 122/88  01/19/23 (!) 150/98   Wt Readings from Last 3 Encounters:  07/14/24 209 lb 9.6 oz (95.1 kg)  03/12/23 211 lb 9.6 oz (96 kg)  01/19/23 205 lb 1.9 oz (93 kg)    Physical Exam Constitutional:      General: He is not in acute distress.    Appearance: Normal appearance.  HENT:     Head: Normocephalic.     Right Ear: Tympanic membrane normal.     Left Ear: Tympanic membrane normal.     Nose: Nose normal.     Mouth/Throat:     Mouth: Mucous membranes are moist.     Pharynx: Oropharynx is clear.  Eyes:     Conjunctiva/sclera: Conjunctivae normal.     Pupils: Pupils are equal, round, and reactive to light.  Neck:     Thyroid : No thyromegaly.  Cardiovascular:     Rate and Rhythm: Normal rate and regular rhythm.     Heart sounds: Normal heart sounds.  Pulmonary:     Effort: Pulmonary effort is normal.     Breath sounds: Normal breath sounds.  Abdominal:     General: Abdomen is flat. Bowel sounds are normal.     Palpations: Abdomen is soft. There is no mass.     Tenderness: There is no abdominal tenderness.  Musculoskeletal:        General: Normal range of motion.  Lymphadenopathy:  Cervical: No cervical adenopathy.  Skin:    General: Skin is warm and dry.     Findings: No rash.  Neurological:     General: No focal deficit present.     Mental Status: He is alert.  Psychiatric:        Mood and Affect: Mood normal.        Behavior: Behavior normal.      Assessment/Plan: Please see individual problem list.  Encounter for general adult medical examination with abnormal findings Assessment & Plan: Physical exam complete. We will check lab work as outlined. Colon cancer screening is due, options discussed. We will proceed with Cologuard, order placed. Encouraged to complete as soon as it arrives. Prostate screening ordered in lab work. Flu and tetanus vaccines are up to date. Declines additional COVID vaccines. He has  not received the shingles vaccine; benefits and side effects were discussed. He will return for this or get at local pharmacy. He does not smoke, drink alcohol, or use drugs and maintains regular dental and eye care. Encourage continued healthy diet and regular exercise. Return to care in 6 months, sooner as needed.   Orders: -     VITAMIN D  25 Hydroxy (Vit-D Deficiency, Fractures)  Nocturia Assessment & Plan: Nocturia is likely due to diuretic timing. Advise to take hydrochlorothiazide  earlier in the afternoon.  Orders: -     PSA  Essential hypertension Assessment & Plan: Hypertension is well-controlled with lisinopril  and hydrochlorothiazide , with blood pressure around 120/80 mmHg. Nocturia is likely due to diuretic timing. Continue lisinopril  and hydrochlorothiazide , advising him to take hydrochlorothiazide  earlier in the afternoon. Check CMP.   Orders: -     Comprehensive metabolic panel with GFR -     CBC with Differential/Platelet -     Lisinopril -hydroCHLOROthiazide ; Take 1 tablet by mouth daily.  Dispense: 90 tablet; Refill: 3  Hyperlipidemia, unspecified hyperlipidemia type Assessment & Plan: Hyperlipidemia is managed with rosuvastatin  without reported side effects. He maintains diet and exercise. Continue rosuvastatin . Check lipid panel.   Orders: -     Lipid panel -     Rosuvastatin  Calcium ; Take 1 tablet (10 mg total) by mouth daily.  Dispense: 90 tablet; Refill: 3  Obesity (BMI 30-39.9) Assessment & Plan: Check A1c. Encourage healthy diet and regular exercise.   Orders: -     Hemoglobin A1c  Dermatitis Assessment & Plan: The recurrent rash has resolved with Kenalog  cream, which has been effective. Differential diagnosis includes a fungal infection. Lotrisone was discussed as an alternative if the rash recurs and is suspected to be fungal. Refill Kenalog  cream and consider Lotrisone if needed.  Orders: -     Triamcinolone  Acetonide; Apply topically 2 (two) times  daily as needed.  Dispense: 30 g; Refill: 2  Screen for colon cancer -     Cologuard  Screening PSA (prostate specific antigen) -     PSA  Thyroid  disorder screen -     TSH     Return in about 6 months (around 01/12/2025) for Follow up.   Nicholas Glance, NP-C [facility]

## 2024-08-12 LAB — COLOGUARD: COLOGUARD: NEGATIVE

## 2025-01-12 ENCOUNTER — Ambulatory Visit: Admitting: Nurse Practitioner
